# Patient Record
Sex: Male | Born: 1965 | Hispanic: Yes | Marital: Single | Smoking: Never smoker
Health system: Southern US, Community
[De-identification: ages and names within clinical notes are randomized; demographics above are authoritative.]

## PROBLEM LIST (undated history)

## (undated) DIAGNOSIS — I10 Essential (primary) hypertension: Secondary | ICD-10-CM

---

## 2021-10-13 ENCOUNTER — Emergency Department (HOSPITAL_BASED_OUTPATIENT_CLINIC_OR_DEPARTMENT_OTHER): Payer: Self-pay

## 2021-10-13 ENCOUNTER — Encounter (HOSPITAL_COMMUNITY)
Admission: EM | Disposition: A | Discharge status: Home / Self Care | Payer: Self-pay | Source: Home / Self Care | Attending: Family Medicine

## 2021-10-13 ENCOUNTER — Emergency Department (HOSPITAL_COMMUNITY): Payer: Self-pay | Admitting: Certified Registered Nurse Anesthetist

## 2021-10-13 ENCOUNTER — Other Ambulatory Visit: Payer: Self-pay

## 2021-10-13 ENCOUNTER — Other Ambulatory Visit (HOSPITAL_BASED_OUTPATIENT_CLINIC_OR_DEPARTMENT_OTHER): Payer: Self-pay

## 2021-10-13 ENCOUNTER — Inpatient Hospital Stay (HOSPITAL_BASED_OUTPATIENT_CLINIC_OR_DEPARTMENT_OTHER)
Admission: EM | Admit: 2021-10-13 | Discharge: 2021-10-16 | DRG: 065 | Disposition: A | Discharge status: Home / Self Care | Payer: Self-pay | Attending: Family Medicine | Admitting: Family Medicine

## 2021-10-13 ENCOUNTER — Encounter (HOSPITAL_BASED_OUTPATIENT_CLINIC_OR_DEPARTMENT_OTHER): Payer: Self-pay

## 2021-10-13 DIAGNOSIS — I10 Essential (primary) hypertension: Secondary | ICD-10-CM

## 2021-10-13 DIAGNOSIS — M4712 Other spondylosis with myelopathy, cervical region: Secondary | ICD-10-CM | POA: Diagnosis present

## 2021-10-13 DIAGNOSIS — E785 Hyperlipidemia, unspecified: Secondary | ICD-10-CM

## 2021-10-13 DIAGNOSIS — R0602 Shortness of breath: Secondary | ICD-10-CM | POA: Diagnosis present

## 2021-10-13 DIAGNOSIS — R1310 Dysphagia, unspecified: Secondary | ICD-10-CM

## 2021-10-13 DIAGNOSIS — I700 Atherosclerosis of aorta: Secondary | ICD-10-CM

## 2021-10-13 DIAGNOSIS — E119 Type 2 diabetes mellitus without complications: Secondary | ICD-10-CM | POA: Diagnosis present

## 2021-10-13 DIAGNOSIS — G959 Disease of spinal cord, unspecified: Secondary | ICD-10-CM

## 2021-10-13 DIAGNOSIS — I6350 Cerebral infarction due to unspecified occlusion or stenosis of unspecified cerebral artery: Principal | ICD-10-CM | POA: Diagnosis present

## 2021-10-13 DIAGNOSIS — R29701 NIHSS score 1: Secondary | ICD-10-CM | POA: Diagnosis present

## 2021-10-13 DIAGNOSIS — R1312 Dysphagia, oropharyngeal phase: Secondary | ICD-10-CM | POA: Diagnosis present

## 2021-10-13 DIAGNOSIS — R471 Dysarthria and anarthria: Secondary | ICD-10-CM | POA: Diagnosis present

## 2021-10-13 DIAGNOSIS — R29810 Facial weakness: Secondary | ICD-10-CM | POA: Diagnosis present

## 2021-10-13 DIAGNOSIS — M4802 Spinal stenosis, cervical region: Secondary | ICD-10-CM

## 2021-10-13 DIAGNOSIS — I639 Cerebral infarction, unspecified: Secondary | ICD-10-CM | POA: Diagnosis present

## 2021-10-13 DIAGNOSIS — Z20822 Contact with and (suspected) exposure to covid-19: Secondary | ICD-10-CM | POA: Diagnosis present

## 2021-10-13 DIAGNOSIS — R2990 Unspecified symptoms and signs involving the nervous system: Secondary | ICD-10-CM | POA: Diagnosis present

## 2021-10-13 HISTORY — PX: ESOPHAGOGASTRODUODENOSCOPY (EGD) WITH PROPOFOL: SHX5813

## 2021-10-13 HISTORY — DX: Essential (primary) hypertension: I10

## 2021-10-13 LAB — CBC WITH DIFFERENTIAL/PLATELET
Abs Immature Granulocytes: 0.03 10*3/uL (ref 0.00–0.07)
Basophils Absolute: 0 10*3/uL (ref 0.0–0.1)
Basophils Relative: 0 %
Eosinophils Absolute: 0.1 10*3/uL (ref 0.0–0.5)
Eosinophils Relative: 1 %
HCT: 45.2 % (ref 39.0–52.0)
Hemoglobin: 15.7 g/dL (ref 13.0–17.0)
Immature Granulocytes: 0 %
Lymphocytes Relative: 17 %
Lymphs Abs: 1.3 10*3/uL (ref 0.7–4.0)
MCH: 31.3 pg (ref 26.0–34.0)
MCHC: 34.7 g/dL (ref 30.0–36.0)
MCV: 90 fL (ref 80.0–100.0)
Monocytes Absolute: 0.4 10*3/uL (ref 0.1–1.0)
Monocytes Relative: 6 %
Neutro Abs: 5.5 10*3/uL (ref 1.7–7.7)
Neutrophils Relative %: 76 %
Platelets: 274 10*3/uL (ref 150–400)
RBC: 5.02 MIL/uL (ref 4.22–5.81)
RDW: 13.3 % (ref 11.5–15.5)
WBC: 7.3 10*3/uL (ref 4.0–10.5)
nRBC: 0 % (ref 0.0–0.2)

## 2021-10-13 LAB — RESP PANEL BY RT-PCR (FLU A&B, COVID) ARPGX2
Influenza A by PCR: NEGATIVE
Influenza B by PCR: NEGATIVE
SARS Coronavirus 2 by RT PCR: NEGATIVE

## 2021-10-13 LAB — COMPREHENSIVE METABOLIC PANEL
ALT: 21 U/L (ref 0–44)
AST: 26 U/L (ref 15–41)
Albumin: 4.8 g/dL (ref 3.5–5.0)
Alkaline Phosphatase: 80 U/L (ref 38–126)
Anion gap: 12 (ref 5–15)
BUN: 10 mg/dL (ref 6–20)
CO2: 28 mmol/L (ref 22–32)
Calcium: 9.2 mg/dL (ref 8.9–10.3)
Chloride: 96 mmol/L — ABNORMAL LOW (ref 98–111)
Creatinine, Ser: 0.81 mg/dL (ref 0.61–1.24)
GFR, Estimated: >60 mL/min (ref >60)
Glucose, Bld: 121 mg/dL — ABNORMAL HIGH (ref 70–99)
Potassium: 3.3 mmol/L — ABNORMAL LOW (ref 3.5–5.1)
Sodium: 136 mmol/L (ref 135–145)
Total Bilirubin: 1.2 mg/dL (ref 0.3–1.2)
Total Protein: 8.8 g/dL — ABNORMAL HIGH (ref 6.5–8.1)

## 2021-10-13 LAB — TROPONIN I (HIGH SENSITIVITY)
Troponin I (High Sensitivity): 12 ng/L (ref <18)
Troponin I (High Sensitivity): 13 ng/L (ref <18)

## 2021-10-13 LAB — GROUP A STREP BY PCR: Group A Strep by PCR: NOT DETECTED

## 2021-10-13 IMAGING — CT CT NECK W/ CM
3 of 4 series · 13 of 33 positions shown, 16 images · IV contrast (Omnipaque)
Comparison: None.

CLINICAL DATA: Palate weakness, difficulty swallowing, evaluate for
retropharyngeal abscess and lemierre's

EXAM:
CT NECK WITH CONTRAST
TECHNIQUE: Multidetector CT imaging of the neck was performed using the
standard protocol following the bolus administration of intravenous
contrast.
CONTRAST:  75mL OMNIPAQUE IOHEXOL 300 MG/ML  SOLN

[Series 3: axial neck · axial · 0.44mm/px · z∈[-238,-80]mm · 5 of 119 slices shown, 7 images]
[im 20/119  soft-tissue]
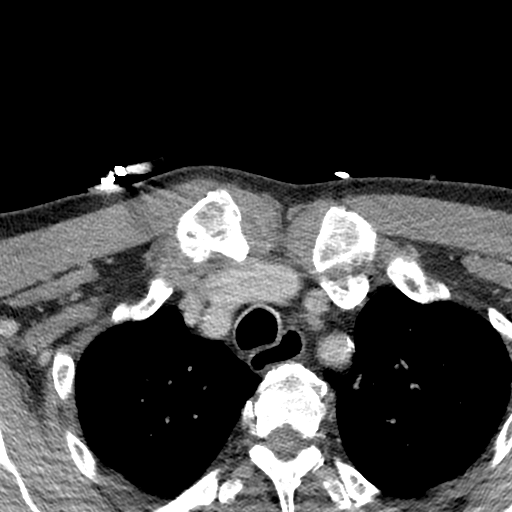
[im 20/119  bone]
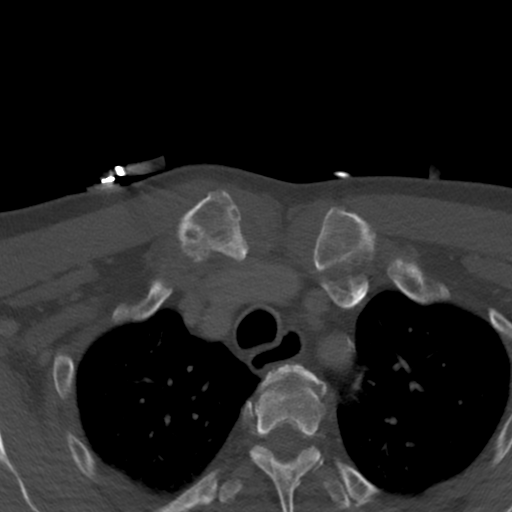
[im 40/119  bone]
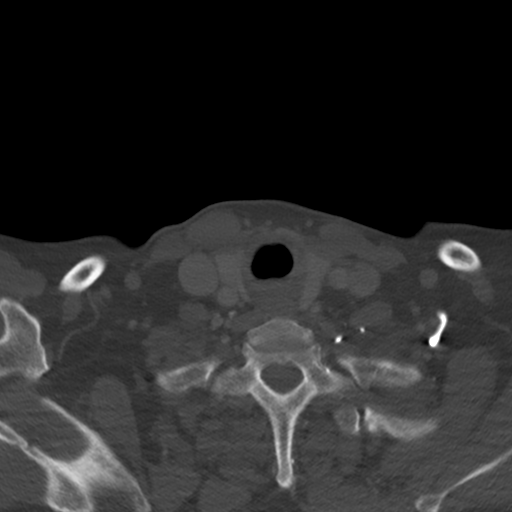
[im 60/119  bone]
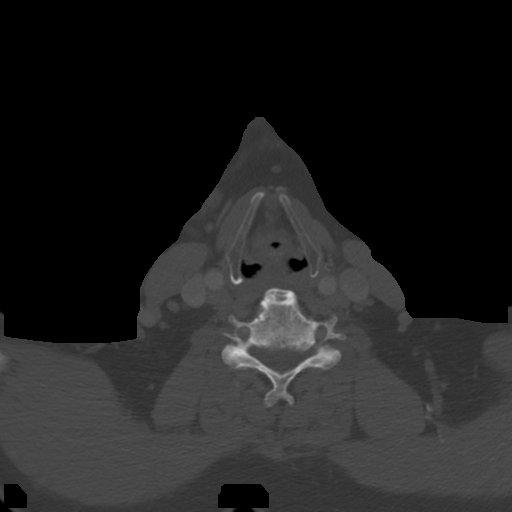
[im 79/119  bone]
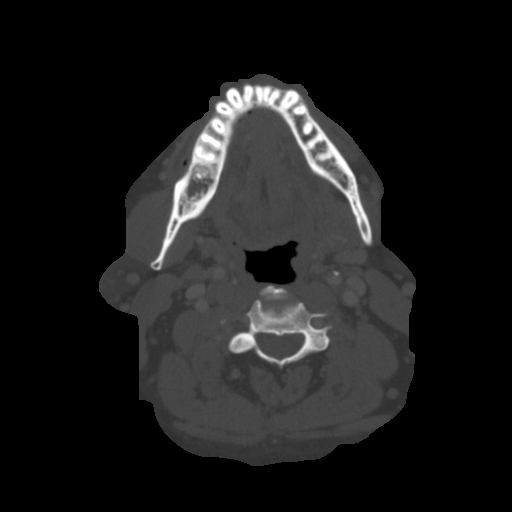
[im 99/119  soft-tissue]
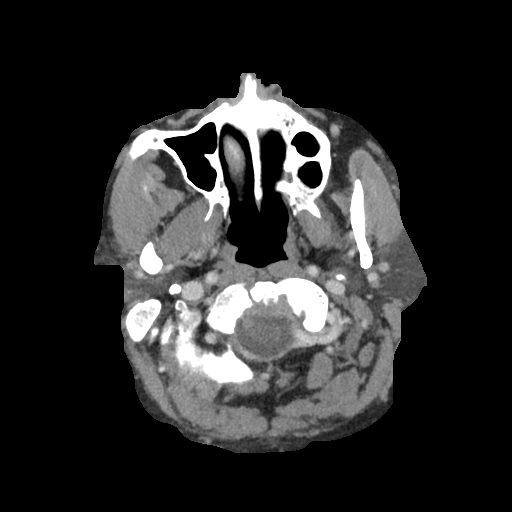
[im 99/119  bone]
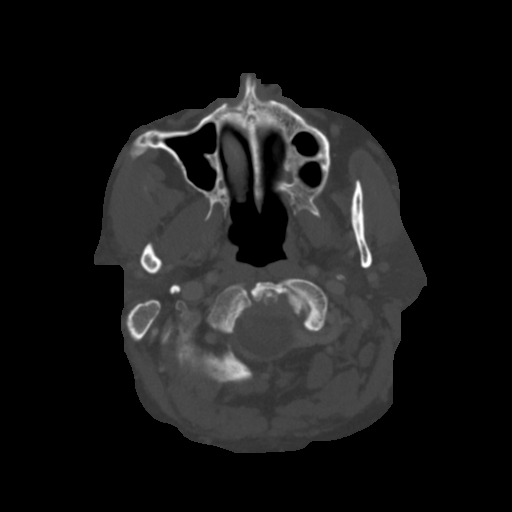

[Series 6: sag neck · sagittal · 0.44mm/px · 5 of 89 slices shown, 6 images]
[im 30/89  bone]
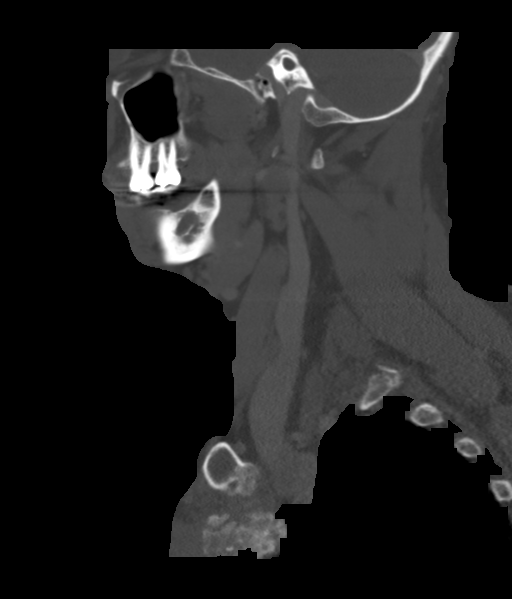
[im 37/89  bone]
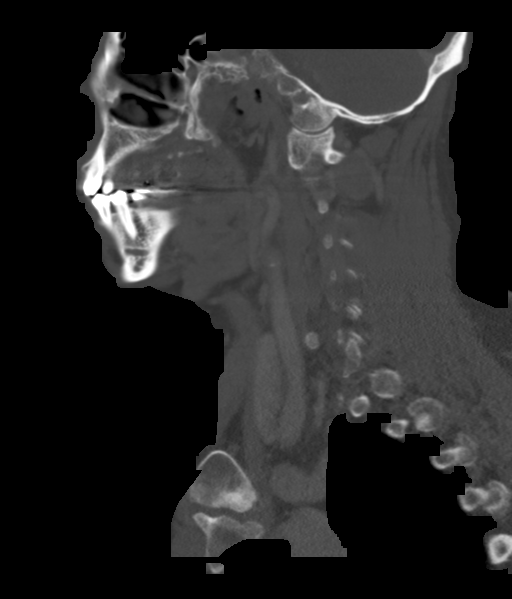
[im 45/89  soft-tissue]
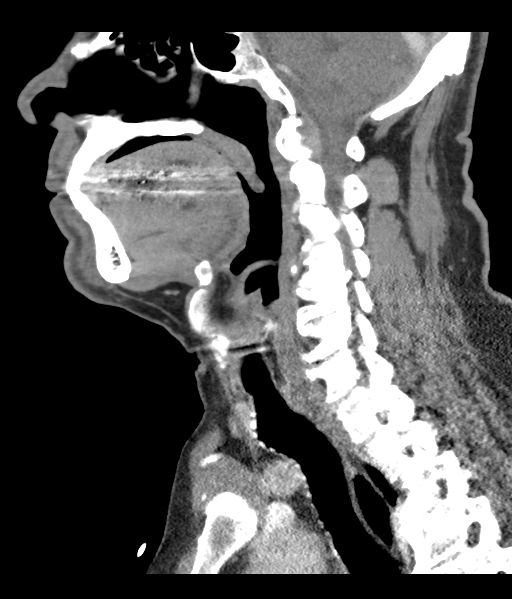
[im 45/89  bone]
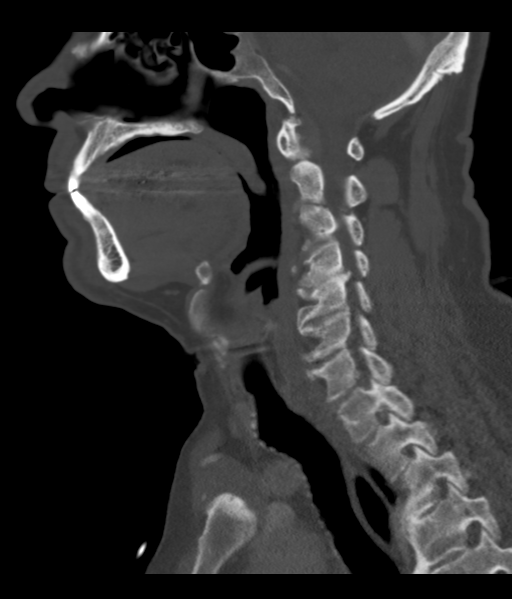
[im 52/89  bone]
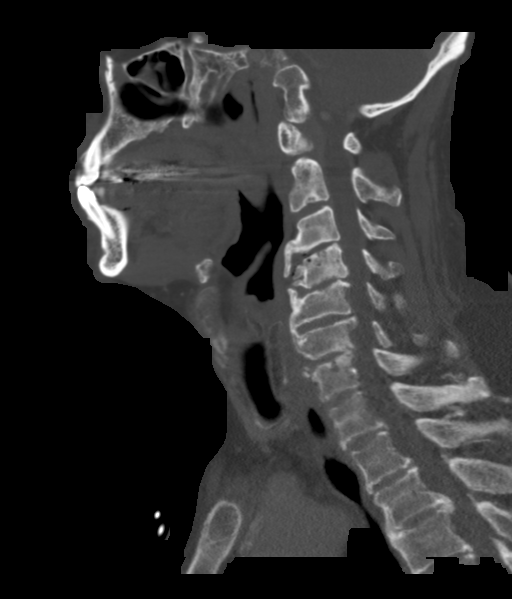
[im 59/89  bone]
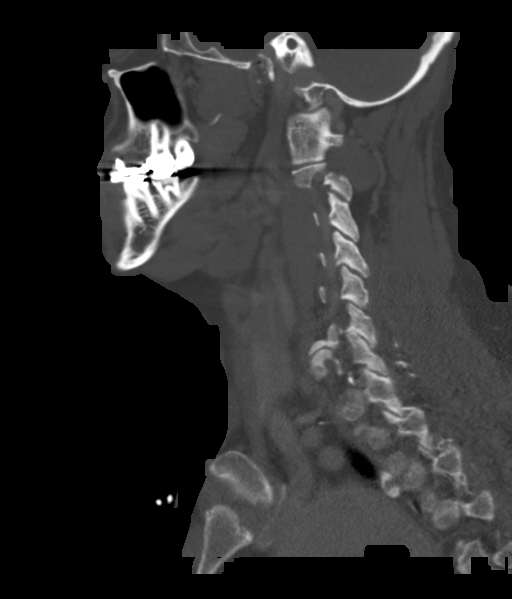

[Series 7: cor neck · coronal · 0.36mm/px · 3 of 104 slices shown]
[im 21/104  bone]
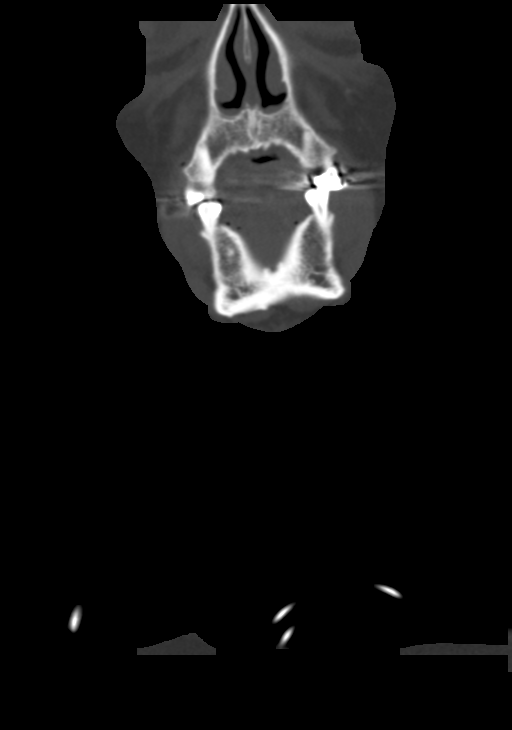
[im 42/104  bone]
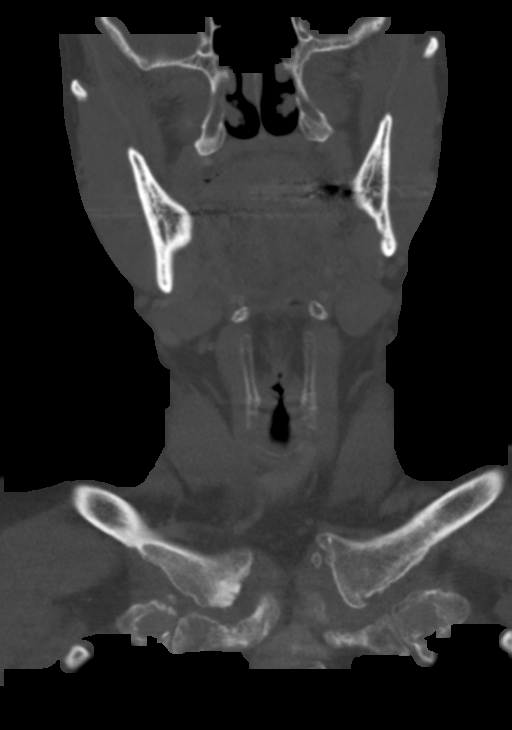
[im 62/104  bone]
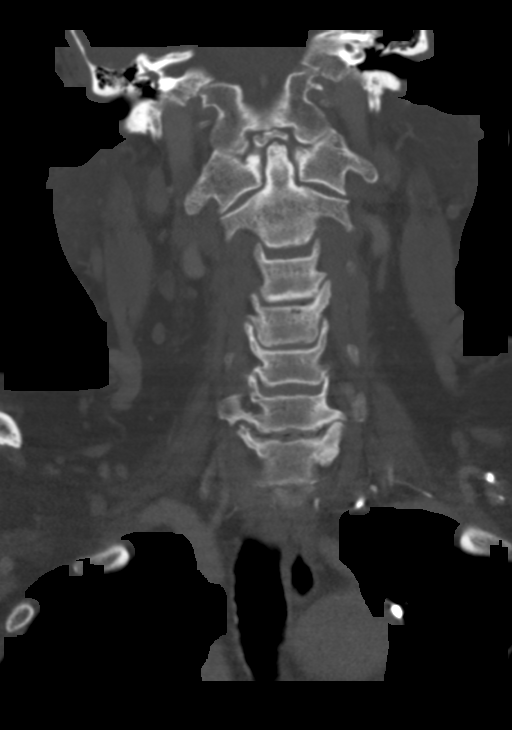

[13 of 33 positions shown; findings below may reference images not displayed]

FINDINGS: Pharynx and larynx: Normal. No mass or swelling.

Salivary glands: No inflammation, mass, or stone.

Thyroid: Normal.

Lymph nodes: None enlarged or abnormal density.

Vascular: Grossly patent arteries and veins.

Limited intracranial: Negative.

Visualized orbits: Negative.

Mastoids and visualized paranasal sinuses: Clear.

Skeleton: Degenerative changes in the cervical spine, with
ossification of the posterior longitudinal ligament at C4-C5 causing
at least moderate spinal canal stenosis. No acute osseous
abnormality.

Upper chest: Focal pulmonary opacity.

Other: None
IMPRESSION: No acute process in the neck. No etiology is seen for the patient's
difficulty swallowing.

## 2021-10-13 IMAGING — DX DG CHEST 1V PORT
1 series · 1 of 1 positions shown · non-contrast
Comparison: None.

CLINICAL DATA: Difficulty swallowing.  Short of breath.

EXAM:
PORTABLE CHEST 1 VIEW

[chest ap]
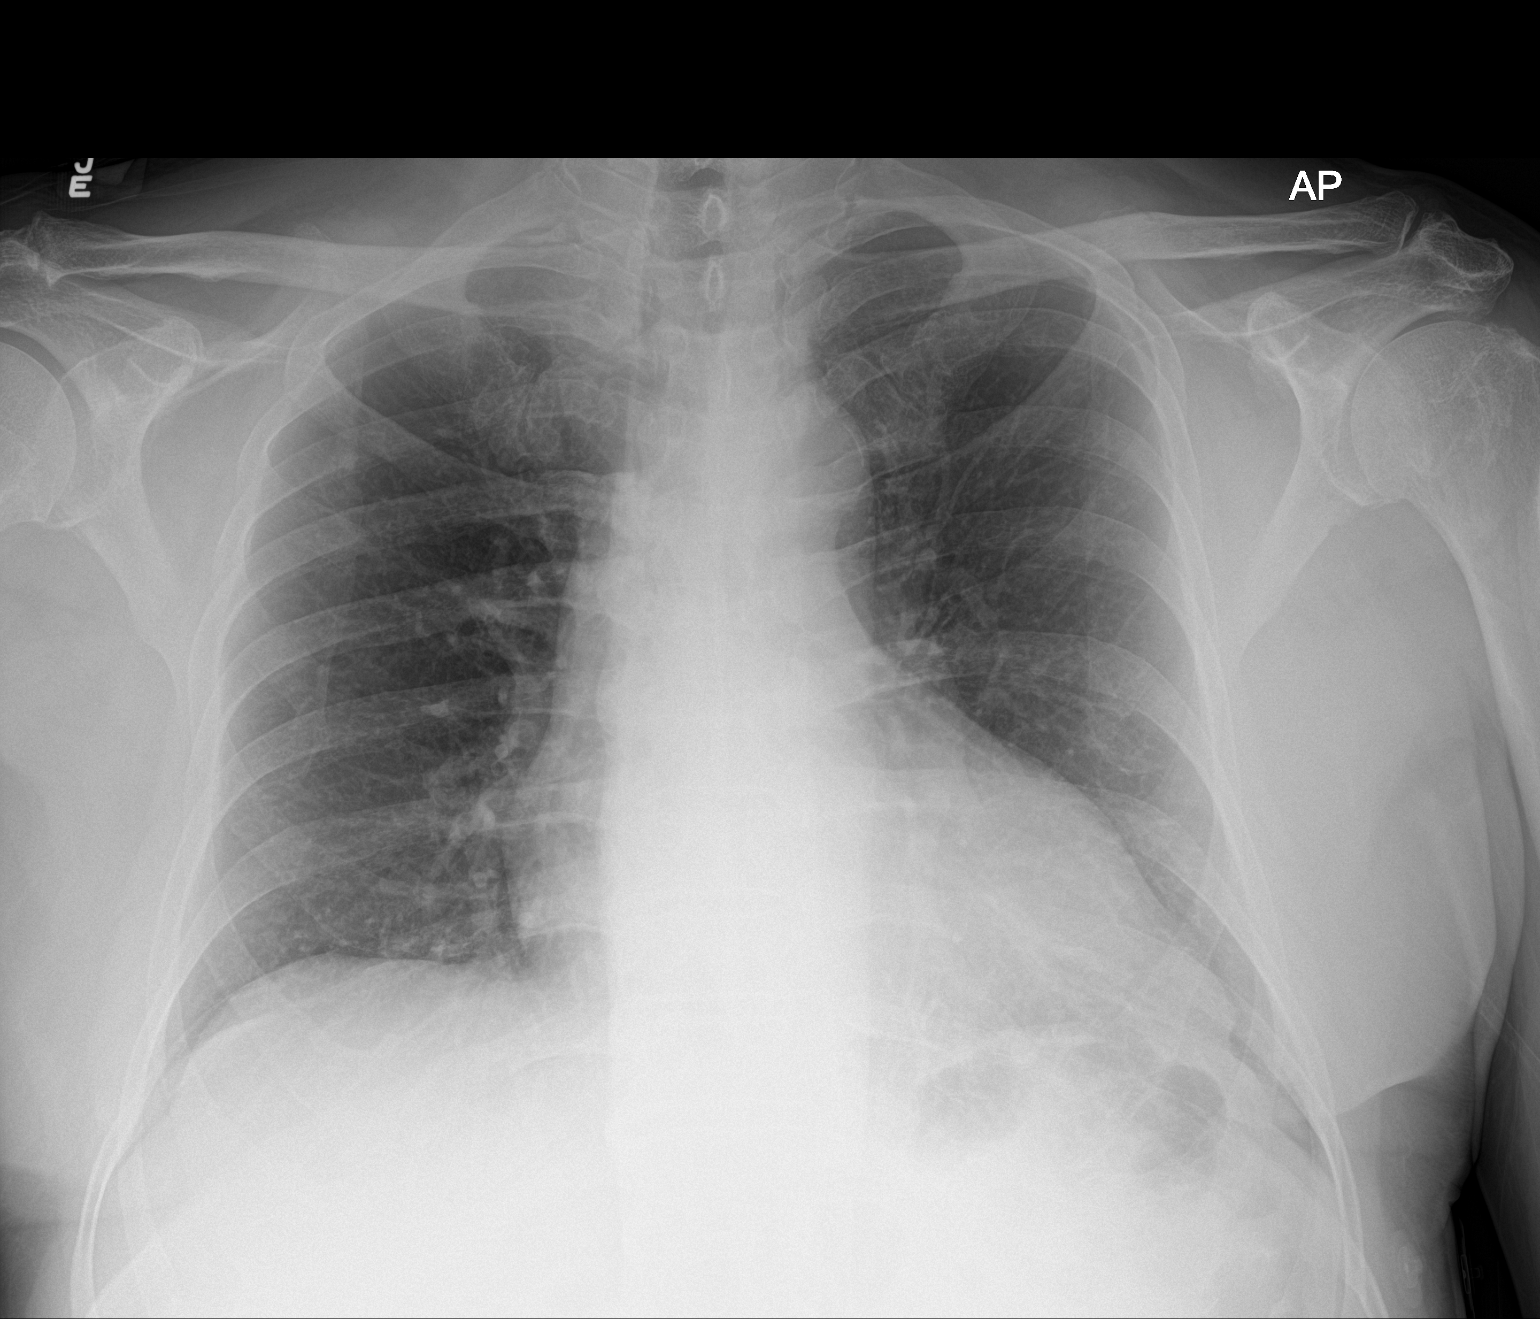

[1 of 1 positions shown; findings below may reference images not displayed]

FINDINGS: Mild cardiac enlargement. Vascularity normal. Lungs are clear
without infiltrate or effusion.
IMPRESSION: No active disease.

## 2021-10-13 SURGERY — ESOPHAGOGASTRODUODENOSCOPY (EGD) WITH PROPOFOL
Anesthesia: General

## 2021-10-13 MED ORDER — LIDOCAINE VISCOUS HCL 2 % MT SOLN
15.0000 mL | Freq: Once | OROMUCOSAL | Status: AC
  Administered 2021-10-14: 15 mL via OROMUCOSAL
  Filled 2021-10-13: qty 15

## 2021-10-13 MED ORDER — IOHEXOL 300 MG/ML  SOLN
100.0000 mL | Freq: Once | INTRAMUSCULAR | Status: AC | PRN
  Administered 2021-10-13: 75 mL via INTRAVENOUS

## 2021-10-13 MED ORDER — SODIUM CHLORIDE 0.9 % IV SOLN: INTRAVENOUS | Status: DC

## 2021-10-13 MED ORDER — PANTOPRAZOLE SODIUM 40 MG PO TBEC
40.0000 mg | DELAYED_RELEASE_TABLET | Freq: Every day | ORAL | 0 refills | Status: DC
  Filled 2021-10-13: qty 30, 30d supply, fill #0

## 2021-10-13 MED ORDER — SUCCINYLCHOLINE CHLORIDE 200 MG/10ML IV SOSY
PREFILLED_SYRINGE | INTRAVENOUS | Status: DC | PRN
  Administered 2021-10-13: 100 mg via INTRAVENOUS

## 2021-10-13 MED ORDER — PROPOFOL 10 MG/ML IV BOLUS
INTRAVENOUS | Status: DC | PRN
Start: 2021-10-13 — End: 2021-10-13
  Administered 2021-10-13: 130 mg via INTRAVENOUS

## 2021-10-13 MED ORDER — ALUM & MAG HYDROXIDE-SIMETH 200-200-20 MG/5ML PO SUSP
30.0000 mL | Freq: Once | ORAL | Status: AC
  Administered 2021-10-13: 30 mL via ORAL
  Filled 2021-10-13: qty 30

## 2021-10-13 MED ORDER — LACTATED RINGERS IV SOLN
INTRAVENOUS | Status: AC | PRN
  Administered 2021-10-13: 1000 mL via INTRAVENOUS

## 2021-10-13 MED ORDER — AMLODIPINE BESYLATE 5 MG PO TABS
5.0000 mg | ORAL_TABLET | Freq: Every day | ORAL | 0 refills | Status: DC
  Filled 2021-10-13: qty 30, 30d supply, fill #0

## 2021-10-13 MED ORDER — DEXAMETHASONE SODIUM PHOSPHATE 10 MG/ML IJ SOLN
INTRAMUSCULAR | Status: DC | PRN
  Administered 2021-10-13: 4 mg via INTRAVENOUS

## 2021-10-13 MED ORDER — SODIUM CHLORIDE 0.9 % IV BOLUS
500.0000 mL | Freq: Once | INTRAVENOUS | Status: AC
  Administered 2021-10-13: 500 mL via INTRAVENOUS

## 2021-10-13 MED ORDER — LIDOCAINE 2% (20 MG/ML) 5 ML SYRINGE
INTRAMUSCULAR | Status: DC | PRN
  Administered 2021-10-13: 80 mg via INTRAVENOUS

## 2021-10-13 MED ORDER — GLUCAGON HCL RDNA (DIAGNOSTIC) 1 MG IJ SOLR
1.0000 mg | Freq: Once | INTRAMUSCULAR | Status: AC
Start: 2021-10-13 — End: 2021-10-13
  Administered 2021-10-13: 1 mg via INTRAVENOUS
  Filled 2021-10-13: qty 1

## 2021-10-13 MED ORDER — ONDANSETRON HCL 4 MG/2ML IJ SOLN
4.0000 mg | Freq: Once | INTRAMUSCULAR | Status: AC
  Administered 2021-10-13: 4 mg via INTRAVENOUS
  Filled 2021-10-13: qty 2

## 2021-10-13 MED ORDER — ONDANSETRON HCL 4 MG/2ML IJ SOLN
INTRAMUSCULAR | Status: DC | PRN
  Administered 2021-10-13: 4 mg via INTRAVENOUS

## 2021-10-13 MED ORDER — LIDOCAINE VISCOUS HCL 2 % MT SOLN
15.0000 mL | Freq: Once | OROMUCOSAL | Status: AC
  Administered 2021-10-13: 15 mL via ORAL
  Filled 2021-10-13: qty 15

## 2021-10-13 MED ORDER — PROPOFOL 500 MG/50ML IV EMUL
INTRAVENOUS | Status: DC | PRN
Start: 2021-10-13 — End: 2021-10-13
  Administered 2021-10-13: 150 ug/kg/min via INTRAVENOUS

## 2021-10-13 SURGICAL SUPPLY — 15 items

## 2021-10-13 NOTE — Anesthesia Procedure Notes (Signed)
Procedure Name: Intubation Date/Time: 10/13/2021 9:14 PM Performed by: Ezekiel Ina, CRNA Pre-anesthesia Checklist: Patient identified, Emergency Drugs available, Suction available and Patient being monitored Patient Re-evaluated:Patient Re-evaluated prior to induction Oxygen Delivery Method: Circle system utilized Preoxygenation: Pre-oxygenation with 100% oxygen Induction Type: IV induction Ventilation: Mask ventilation without difficulty Laryngoscope Size: Miller and 2 Grade View: Grade I Tube type: Oral Tube size: 7.5 mm Number of attempts: 1 Airway Equipment and Method: Stylet Placement Confirmation: ETT inserted through vocal cords under direct vision, positive ETCO2 and breath sounds checked- equal and bilateral Secured at: 23 cm Tube secured with: Tape Dental Injury: Teeth and Oropharynx as per pre-operative assessment

## 2021-10-13 NOTE — ED Notes (Signed)
RT assessed in triage. BBS clear. Good air movement in throat. Stated he was having trouble swallowing earlier, but has gotten better. SAT 98%

## 2021-10-13 NOTE — Op Note (Signed)
[facility] Patient Name: Gary Richmond Procedure Date: 10/13/2021 MRN: [id number] Attending MD: Kerin Salen , MD Date of Birth: 1966-10-09 CSN: [id number] Age: 56 Admit Type: Outpatient Procedure:                Upper GI endoscopy Indications:              Dysphagia, inability to tolerate liquids without                            immediate regurgitation, suspected esophageal food                            bolus impaction Providers:                Kerin Salen, MD, Roselie Awkward, RN, Rozetta Nunnery,                            Technician Referring MD:             ER physician Medicines:                Monitored Anesthesia Care Complications:            No immediate complications. Estimated Blood Loss:     Estimated blood loss: none. Procedure:                Pre-Anesthesia Assessment:                           - Prior to the procedure, a History and Physical                            was performed, and patient medications and                            allergies were reviewed. The patient's tolerance of                            previous anesthesia was also reviewed. The risks                            and benefits of the procedure and the sedation                            options and risks were discussed with the patient.                            All questions were answered, and informed consent                            was obtained. Prior Anticoagulants: The patient has                            taken no previous anticoagulant or antiplatelet                            agents. ASA Grade Assessment:  II - A patient with                            mild systemic disease. After reviewing the risks                            and benefits, the patient was deemed in                            satisfactory condition to undergo the procedure.                           After obtaining informed consent, the endoscope was                            passed under  direct vision. Throughout the                            procedure, the patient's blood pressure, pulse, and                            oxygen saturations were monitored continuously. The                            [id number]-[id number] ([id number] Olympus endoscope was introduced                            through the mouth, and advanced to the third part                            of duodenum. The upper GI endoscopy was                            accomplished without difficulty. The patient                            tolerated the procedure well. Scope In: Scope Out: Findings:      The examined esophagus was normal.      The Z-line was regular.      A large ball of food (residue) was found in the gastric body.      The cardia and gastric fundus were normal on retroflexion.      Otherwise unremarkable gastric cavity, incisura, antrum and pylorus.      The examined duodenum was normal. Impression:               - Normal esophagus.                           - Z-line regular.                           - A large amount of food (residue) in the stomach.                           - Normal examined duodenum.                           -  No specimens collected. Moderate Sedation:      Patient did not receive moderate sedation for this procedure, but       instead received monitored anesthesia care. Recommendation:           - Discharge patient to home.                           - Advance diet as tolerated. Procedure Code(s):        --- Professional ---                           [phone], Esophagogastroduodenoscopy, flexible,                            transoral; diagnostic, including collection of                            specimen(s) by brushing or washing, when performed                            (separate procedure) Diagnosis Code(s):        --- Professional ---                           R13.10, Dysphagia, unspecified CPT copyright 2019 [employer]. All rights reserved. The codes  documented in this report are preliminary and upon coder review may  be revised to meet current compliance requirements. Kerin Salen, MD 10/13/2021 9:30:46 PM This report has been signed electronically. Number of Addenda: 0

## 2021-10-13 NOTE — ED Provider Notes (Addendum)
[facility] EMERGENCY DEPARTMENT Provider Note   CSN: [id number] Arrival date & time: 10/13/21  1113     History  Chief Complaint  Patient presents with   Shortness of Breath    Gary Richmond is a 56 y.o. male.  HPI      56yo male with history of hypertension, DM, presents with acute onset of difficulty swallowing, feeling of shortness of breath, discomfort in throat.   For one hour has had pain in the throat Feels clogged in throat but doesn't really hurt Feels difficulty speaking Shortness of breath, feels like can breath through nose but not mouth When drink water but water coming back up  Standing at the store waiting for the rain to stop 1.5 felt suddenly like couldn't swallow, like something stuck in throat, blocking, difficulty swallowing water Feels like voice different, difficulty speaking Throat feels a little bit sore but not severe, now doesn't feel sore  No cough or congestion in last few days  No headache, numbness, weakness, difficulty speaking/finding words  No chest pain, doesn't feel dyspne abut more that throat feels funny  No rash, nausea, vomiting, diarrhea, fever No hx of allergies  Spanish interpreter used  Home Medications Prior to Admission medications   Not on File      Allergies    Patient has no known allergies.    Review of Systems   Review of Systems  Constitutional:  Negative for fever.  HENT:  Positive for sore throat (slight), trouble swallowing and voice change. Negative for congestion.   Respiratory:  Positive for shortness of breath.   Gastrointestinal:  Negative for abdominal pain, nausea and vomiting.  Genitourinary:  Negative for dysuria.  Skin:  Negative for rash.  Neurological:  Negative for dizziness, facial asymmetry, speech difficulty, weakness, numbness and headaches.   Physical Exam Updated Vital Signs BP (!) 144/81    Pulse 68    Temp 97.8 F (36.6 C)    Resp 15    Ht 5\' 5"  (1.651 m)     Wt 72.6 kg    SpO2 95%    BMI 26.63 kg/m  Physical Exam Vitals and nursing note reviewed.  Constitutional:      General: He is not in acute distress.    Appearance: Normal appearance. He is well-developed. He is not ill-appearing or diaphoretic.  HENT:     Head: Normocephalic and atraumatic.     Mouth/Throat:     Pharynx: No pharyngeal swelling or oropharyngeal exudate.  Eyes:     General: No visual field deficit.    Extraocular Movements: Extraocular movements intact.     Conjunctiva/sclera: Conjunctivae normal.     Pupils: Pupils are equal, round, and reactive to light.  Neck:     Comments: Tender right submandibular area Cardiovascular:     Rate and Rhythm: Normal rate and regular rhythm.     Pulses: Normal pulses.     Heart sounds: Normal heart sounds. No murmur heard.   No friction rub. No gallop.  Pulmonary:     Effort: Pulmonary effort is normal. No respiratory distress.     Breath sounds: Normal breath sounds. No wheezing or rales.  Abdominal:     General: There is no distension.     Palpations: Abdomen is soft.     Tenderness: There is no abdominal tenderness. There is no guarding.  Musculoskeletal:        General: No swelling or tenderness.     Cervical back: Normal range  of motion.  Skin:    General: Skin is warm and dry.     Findings: No erythema or rash.  Neurological:     General: No focal deficit present.     Mental Status: He is alert and oriented to person, place, and time.     GCS: GCS eye subscore is 4. GCS verbal subscore is 5. GCS motor subscore is 6.     Cranial Nerves: No cranial nerve deficit, dysarthria or facial asymmetry.     Sensory: No sensory deficit.     Motor: No weakness or tremor.     Coordination: Coordination normal. Finger-Nose-Finger Test normal.     Gait: Gait normal.    ED Results / Procedures / Treatments   Labs (all labs ordered are listed, but only abnormal results are displayed) Labs Reviewed  COMPREHENSIVE METABOLIC  PANEL - Abnormal; Notable for the following components:      Result Value   Potassium 3.3 (*)    Chloride 96 (*)    Glucose, Bld 121 (*)    Total Protein 8.8 (*)    All other components within normal limits  RESP PANEL BY RT-PCR (FLU A&B, COVID) ARPGX2  GROUP A STREP BY PCR  CBC WITH DIFFERENTIAL/PLATELET  TROPONIN I (HIGH SENSITIVITY)  TROPONIN I (HIGH SENSITIVITY)    EKG EKG Interpretation  Date/Time:  Wednesday October 13 2021 11:27:34 EST Ventricular Rate:  77 PR Interval:  152 QRS Duration: 82 QT Interval:  404 QTC Calculation: 457 R Axis:   27 Text Interpretation: Sinus rhythm with Premature supraventricular complexes Left 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 CONTRAST:  17mL OMNIPAQUE IOHEXOL 300 MG/ML  SOLN COMPARISON:  None. FINDINGS: Pharynx and larynx: Normal. No mass or swelling. Salivary glands: No inflammation, mass, or stone. Thyroid: Normal. Lymph nodes: None enlarged or abnormal density. Vascular: Grossly patent arteries and veins. Limited intracranial: Negative. Visualized orbits: Negative. Mastoids and visualized paranasal sinuses: Clear. 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 No acute osseous abnormality. Upper chest: Focal pulmonary opacity. Other: None IMPRESSION: No acute process in the neck. No etiology is seen for the patient's difficulty swallowing.  Electronically Signed   By: Merilyn Baba M.D.   On: 10/13/2021 15:05   DG Chest Port 1 View  Result Date: 10/13/2021 CLINICAL DATA:  Difficulty swallowing.  Short of breath. EXAM: PORTABLE CHEST 1 VIEW COMPARISON:  None. FINDINGS: Mild cardiac enlargement. Vascularity normal. Lungs are clear without infiltrate or effusion. IMPRESSION: No active disease. Electronically Signed   By: Franchot Gallo M.D.   On: 10/13/2021 17:50    Procedures Procedures    Medications Ordered in ED Medications  0.9 %  sodium chloride infusion ( Intravenous New Bag/Given 10/13/21 1806)  lidocaine (XYLOCAINE) 2 % viscous mouth solution 15 mL (has no administration in time range)  iohexol (OMNIPAQUE) 300 MG/ML solution 100 mL (75 mLs Intravenous Contrast Given 10/13/21 1414)  alum & mag hydroxide-simeth (MAALOX/MYLANTA) 200-200-20 MG/5ML suspension 30 mL (30 mLs Oral Given 10/13/21 1613)  And  lidocaine (XYLOCAINE) 2 % viscous mouth solution 15 mL (15 mLs Oral Given 10/13/21 1613)  sodium chloride 0.9 % bolus 500 mL (0 mLs Intravenous Stopped 10/13/21 1942)  glucagon (human recombinant) (GLUCAGEN) injection 1 mg (1 mg Intravenous Given 10/13/21 1830)  ondansetron (ZOFRAN) injection 4 mg (4 mg Intravenous Given 10/13/21 1827)  lactated ringers infusion ( Intravenous Stopped 10/13/21 2139)    ED Course/ Medical Decision Making/ A&P                           Medical Decision Making  56yo male with history of hypertension, DM, presents with acute onset of difficulty swallowing, feeling of shortness of breath, discomfort in throat.   DDx includes anaphylaxis, CVA, cardiac etiology, RPA, PTA, food impaction, strep/covid/flu, other neurologic abnormality, reflux, globus sensation, vocal cord dysfunction with broad continuing differential.  EKG with STE, pattern most consistent with LVH, delta troponins negative and doubt ACS. No chest pain, just throat/swallowing problems.  No triggers or signs of anaphylaxis on exam or  history.  Strep/COVID/flu testing negative. CT soft tissue neck does not show any acute abnormalities, no abscess, no airway narrowing or masses.  Neurologic exam without abnormalities, palate appears to have normal rise, do not see signs of CVA.   Observed to swallow water without aspiration or regurgitation, was not eating when this began and given tolerance of fluid on my exam I doubt food impaction.   Discussed medical decision making in detail using spanish interpreter. Ordered GI cocktail, plan to reinitiate blood pressure medications and start PPI for possible reflux and refer to ENT for further evaluation of dysphagia, PCP for formal speech evaluation.      Final Clinical Impression(s) / ED Diagnoses Final diagnoses:  Dysphagia, unspecified type    Rx / DC Orders ED Discharge Orders          Ordered    pantoprazole (PROTONIX) 40 MG tablet  Daily,   Status:  Discontinued        10/13/21 1603    amLODipine (NORVASC) 5 MG tablet  Daily,   Status:  Discontinued        10/13/21 1603                Gareth Morgan, MD 10/14/21 [phone]

## 2021-10-13 NOTE — ED Notes (Signed)
All belongings gathered and placed at bedside. Pt updated with interpreter and all questions answered.

## 2021-10-13 NOTE — ED Provider Notes (Addendum)
Patient has shifted from concerns about an abnormality in the pharyngeal area to possible esophageal impaction.  Patient not keeping saliva down.  Patient when he swallows water seems to go past the area of the pharynx and then eventually comes back up.  May be a communication issue with patient.  Patient apparently had acute onset of this just prior to arrival.  Also of significance patient stated that this started to happen when he was eating and drinking.  Patient thought his throat was closing off.  Patient's voice is very normal.  No trouble speaking at all.  Clinically it acts as if there is an esophageal food impaction.  Will discuss with gastroenterology for consideration for upper endoscopy.  We will add on chest x-ray we will start some IV fluids.   Vanetta Mulders, MD 10/13/21 1733    Vanetta Mulders, MD 10/13/21 1735  CRITICAL CARE Performed by: Vanetta Mulders Total critical care time: 35 minutes Critical care time was exclusive of separately billable procedures and treating other patients. Critical care was necessary to treat or prevent imminent or life-threatening deterioration. Critical care was time spent personally by me on the following activities: development of treatment plan with patient and/or surrogate as well as nursing, discussions with consultants, evaluation of patient's response to treatment, examination of patient, obtaining history from patient or surrogate, ordering and performing treatments and interventions, ordering and review of laboratory studies, ordering and review of radiographic studies, pulse oximetry and re-evaluation of patient's condition.   Discussed with Dr. Marca Ancona on-call for gastroenterology unassigned at [facility].  Initially she wanted to try glucagon.  We have given him good colon but have not had much chance to see if it was going to work she called back change her mind said she probably best to just scope him.  This may just be dysphagia.   But it is seems that when he drinks fluids that something gets stuck about mid esophagus.  According to him last food was 7 PM yesterday did not eat anything today has had trouble with drinking stuff today.  Plan to send him to the [facility] long ED discussed with Dr. Wallace Cullens [facility] will transfer.  When he arrives Dr. Marca Ancona and endoscopy team needs to be notified.  They are waiting for him.  If the scope is negative then it will be a question whether he is able to swallow liquids at all if not he may require hospitalist admission.  Obvious if they find a food impaction then clearing that will probably solve the problem.   Vanetta Mulders, MD 10/13/21 Cecille Aver, MD 10/13/21 [phone]

## 2021-10-13 NOTE — ED Notes (Signed)
Pt attempted to swallow water. Pt drank water and then a few second later pt indicated he could not keep it down and spat it back up. Pt did not cough.

## 2021-10-13 NOTE — Anesthesia Preprocedure Evaluation (Addendum)
Anesthesia Evaluation  Patient identified by MRN, date of birth, ID band Patient awake    Reviewed: Allergy & Precautions, NPO status , Patient's Chart, lab work & pertinent test results  Airway Mallampati: II  TM Distance: >3 FB Neck ROM: Full    Dental no notable dental hx. (+) Teeth Intact, Dental Advisory Given, Implants,    Pulmonary neg pulmonary ROS,    Pulmonary exam normal breath sounds clear to auscultation       Cardiovascular Exercise Tolerance: Good hypertension, Pt. on medications Normal cardiovascular exam Rhythm:Regular Rate:Normal  10/13/21 EKG  SR R 77 w PAC and LVH   Neuro/Psych negative neurological ROS  negative psych ROS   GI/Hepatic GERD  ,  Endo/Other  negative endocrine ROS  Renal/GU Lab Results      Component                Value               Date                      CREATININE               0.81                10/13/2021                BUN                      10                  10/13/2021                        K                        3.3 (L)             10/13/2021                    Musculoskeletal negative musculoskeletal ROS (+)   Abdominal   Peds  Hematology Lab Results      Component                Value               Date                          HGB                      15.7                10/13/2021                HCT                      45.2                10/13/2021                PLT                      274                 10/13/2021              Anesthesia Other  Findings   Reproductive/Obstetrics                           Anesthesia Physical Anesthesia Plan  ASA: 2 and emergent  Anesthesia Plan: General   Post-op Pain Management:    Induction: Cricoid pressure planned, Rapid sequence and Intravenous  PONV Risk Score and Plan: 2 and Treatment may vary due to age or medical condition and TIVA  Airway Management Planned: Oral  ETT  Additional Equipment: None  Intra-op Plan:   Post-operative Plan: Extubation in OR  Informed Consent: I have reviewed the patients History and Physical, chart, labs and discussed the procedure including the risks, benefits and alternatives for the proposed anesthesia with the patient or authorized representative who has indicated his/her understanding and acceptance.     Dental advisory given and Interpreter used for interveiw  Plan Discussed with: CRNA and Anesthesiologist  Anesthesia Plan Comments: Paramedic used )    Anesthesia Quick Evaluation

## 2021-10-13 NOTE — H&P (Signed)
Gary Richmond is an 56 y.o. male.   Chief Complaint: Difficulty swallowing solids or liquids HPI: 56 year old Spanish-speaking male states that he was in his usual state of health until 7 PM yesterday after which he has noted difficulty swallowing liquids. Patient is a poor historian but states that yesterday he ate hamburgers, hot dogs without any issues and never felt like food was stuck in his throat or esophagus.  There after when he tried drinking fluids such as soda he noticed that it would immediately come up. Patient states he has not had anything to eat the whole day today, however on several attempts at trying to drink, the fluid comes up immediately. Patient denies prior difficulty swallowing solids or liquids. In the ER he was found to be unable to tolerate liquids. As per ER attending, he has been in the ER for over 7 hours without change in symptoms. He was given glucagon 1 dose in the ER and transferred to [facility] endoscopy for emergent/urgent EGD.  Past Medical History:  Diagnosis Date   Hypertension     History reviewed. No pertinent surgical history.  History reviewed. No pertinent family history. Social History:  reports that he has never smoked. He has never used smokeless tobacco. He reports current alcohol use. He reports that he does not use drugs.  Allergies: No Known Allergies  No medications prior to admission.    Results for orders placed or performed during the hospital encounter of 10/13/21 (from the past 48 hour(s))  CBC with Differential     Status: None   Collection Time: 10/13/21 12:28 PM  Result Value Ref Range   WBC 7.3 4.0 - 10.5 K/uL   RBC 5.02 4.22 - 5.81 MIL/uL   Hemoglobin 15.7 13.0 - 17.0 g/dL   HCT 16.1 09.6 - 04.5 %   MCV 90.0 80.0 - 100.0 fL   MCH 31.3 26.0 - 34.0 pg   MCHC 34.7 30.0 - 36.0 g/dL   RDW 40.9 81.1 - 91.4 %   Platelets 274 150 - 400 K/uL   nRBC 0.0 0.0 - 0.2 %   Neutrophils Relative % 76 %   Neutro Abs 5.5  1.7 - 7.7 K/uL   Lymphocytes Relative 17 %   Lymphs Abs 1.3 0.7 - 4.0 K/uL   Monocytes Relative 6 %   Monocytes Absolute 0.4 0.1 - 1.0 K/uL   Eosinophils Relative 1 %   Eosinophils Absolute 0.1 0.0 - 0.5 K/uL   Basophils Relative 0 %   Basophils Absolute 0.0 0.0 - 0.1 K/uL   Immature Granulocytes 0 %   Abs Immature Granulocytes 0.03 0.00 - 0.07 K/uL    Comment: Performed at [facility], [address] [place], [place] [address]  Troponin I (High Sensitivity)     Status: None   Collection Time: 10/13/21 12:28 PM  Result Value Ref Range   Troponin I (High Sensitivity) 12 <18 ng/L    Comment: (NOTE) Elevated high sensitivity troponin I (hsTnI) values and significant  changes across serial measurements may suggest ACS but many other  chronic and acute conditions are known to elevate hsTnI results.  Refer to the "Links" section for chest pain algorithms and additional  guidance. Performed at [facility], [address] [place], [place] [address]   Resp Panel by RT-PCR (Flu A&B, Covid) Nasopharyngeal Swab     Status: None   Collection Time: 10/13/21 12:29 PM   Specimen: Nasopharyngeal Swab; Nasopharyngeal(NP) swabs in  vial transport medium  Result Value Ref Range   SARS Coronavirus 2 by RT PCR NEGATIVE NEGATIVE    Comment: (NOTE) SARS-CoV-2 target nucleic acids are NOT DETECTED.  The SARS-CoV-2 RNA is generally detectable in upper respiratory specimens during the acute phase of infection. The lowest concentration of SARS-CoV-2 viral copies this assay can detect is 138 copies/mL. A negative result does not preclude SARS-Cov-2 infection and should not be used as the sole basis for treatment or other patient management decisions. A negative result may occur with  improper specimen collection/handling, submission of specimen other than nasopharyngeal swab, presence of viral mutation(s) within the areas targeted by this assay, and inadequate number of  viral copies(<138 copies/mL). A negative result must be combined with clinical observations, patient history, and epidemiological information. The expected result is Negative.  Fact Sheet for Patients:  BloggerCourse.comhttps://www.fda.gov/media/152166/download  Fact Sheet for Healthcare Providers:  SeriousBroker.ithttps://www.fda.gov/media/152162/download  This test is no t yet approved or cleared by the [place] FDA and  has been authorized for detection and/or diagnosis of SARS-CoV-2 by FDA under an Emergency Use Authorization (EUA). This EUA will remain  in effect (meaning this test can be used) for the duration of the COVID-19 declaration under Section 564(b)(1) of the Act, 21 U.S.C.section 360bbb-3(b)(1), unless the authorization is terminated  or revoked sooner.       Influenza A by PCR NEGATIVE NEGATIVE   Influenza B by PCR NEGATIVE NEGATIVE    Comment: (NOTE) The Xpert Xpress SARS-CoV-2/FLU/RSV plus assay is intended as an aid in the diagnosis of influenza from Nasopharyngeal swab specimens and should not be used as a sole basis for treatment. Nasal washings and aspirates are unacceptable for Xpert Xpress SARS-CoV-2/FLU/RSV testing.  Fact Sheet for Patients: BloggerCourse.comhttps://www.fda.gov/media/152166/download  Fact Sheet for Healthcare Providers: SeriousBroker.ithttps://www.fda.gov/media/152162/download  This test is not yet approved or cleared by the [place] FDA and has been authorized for detection and/or diagnosis of SARS-CoV-2 by FDA under an Emergency Use Authorization (EUA). This EUA will remain in effect (meaning this test can be used) for the duration of the COVID-19 declaration under Section 564(b)(1) of the Act, 21 U.S.C. section 360bbb-3(b)(1), unless the authorization is terminated or revoked.  Performed at [facility], [address] [place], [place] [address]   Group A Strep by PCR     Status: None   Collection Time: 10/13/21 12:29 PM   Specimen: Nasopharyngeal Swab; Sterile Swab   Result Value Ref Range   Group A Strep by PCR NOT DETECTED NOT DETECTED    Comment: Performed at [facility], [address] [place], [place] [address]  Comprehensive metabolic panel     Status: Abnormal   Collection Time: 10/13/21  1:34 PM  Result Value Ref Range   Sodium 136 135 - 145 mmol/L   Potassium 3.3 (L) 3.5 - 5.1 mmol/L   Chloride 96 (L) 98 - 111 mmol/L   CO2 28 22 - 32 mmol/L   Glucose, Bld 121 (H) 70 - 99 mg/dL    Comment: Glucose reference range applies only to samples taken after fasting for at least 8 hours.   BUN 10 6 - 20 mg/dL   Creatinine, Ser 4.130.81 0.61 - 1.24 mg/dL   Calcium 9.2 8.9 - 24.410.3 mg/dL   Total Protein 8.8 (H) 6.5 - 8.1 g/dL   Albumin 4.8 3.5 - 5.0 g/dL   AST 26 15 - 41 U/L   ALT 21 0 - 44 U/L   Alkaline  Phosphatase 80 38 - 126 U/L   Total Bilirubin 1.2 0.3 - 1.2 mg/dL   GFR, Estimated >50 >03 mL/min    Comment: (NOTE) Calculated using the CKD-EPI Creatinine Equation (2021)    Anion gap 12 5 - 15    Comment: Performed at [facility], [address] [place], [place] [address]  Troponin I (High Sensitivity)     Status: None   Collection Time: 10/13/21  2:40 PM  Result Value Ref Range   Troponin I (High Sensitivity) 13 <18 ng/L    Comment: (NOTE) Elevated high sensitivity troponin I (hsTnI) values and significant  changes across serial measurements may suggest ACS but many other  chronic and acute conditions are known to elevate hsTnI results.  Refer to the "Links" section for chest pain algorithms and additional  guidance. Performed at [facility], [address] [place], [place] [address]    CT Soft Tissue Neck W Contrast  Result Date: 10/13/2021 CLINICAL DATA:  Palate weakness, difficulty swallowing, evaluate for retropharyngeal abscess and lemierre's EXAM: CT NECK WITH CONTRAST TECHNIQUE: Multidetector CT imaging of the neck was performed using the standard protocol following the bolus administration of  intravenous contrast. CONTRAST:  57mL OMNIPAQUE IOHEXOL 300 MG/ML  SOLN COMPARISON:  None. FINDINGS: Pharynx and larynx: Normal. No mass or swelling. Salivary glands: No inflammation, mass, or stone. Thyroid: Normal. Lymph nodes: None enlarged or abnormal density. Vascular: Grossly patent arteries and veins. Limited intracranial: Negative. Visualized orbits: Negative. Mastoids and visualized paranasal sinuses: Clear. 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 No acute osseous abnormality. Upper chest: Focal pulmonary opacity. Other: None IMPRESSION: No acute process in the neck. No etiology is seen for the patient's difficulty swallowing. Electronically Signed   By: Wiliam Ke M.D.   On: 10/13/2021 15:05   DG Chest Port 1 View  Result Date: 10/13/2021 CLINICAL DATA:  Difficulty swallowing.  Short of breath. EXAM: PORTABLE CHEST 1 VIEW COMPARISON:  None. FINDINGS: Mild cardiac enlargement. Vascularity normal. Lungs are clear without infiltrate or effusion. IMPRESSION: No active disease. Electronically Signed   By: Marlan Palau M.D.   On: 10/13/2021 17:50    Review of Systems  Constitutional: Negative.   Respiratory:  Positive for choking.   Cardiovascular: Negative.   Gastrointestinal: Negative.    Blood pressure (!) 158/71, pulse 73, temperature 98 F (36.7 C), temperature source Oral, resp. rate 14, height 5\' 5"  (1.651 m), weight 72.6 kg, SpO2 99 %. Physical Exam Constitutional:      Appearance: He is well-developed.  HENT:     Head: Normocephalic and atraumatic.  Cardiovascular:     Rate and Rhythm: Normal rate and regular rhythm.  Pulmonary:     Effort: Pulmonary effort is normal.  Neurological:     General: No focal deficit present.     Mental Status: He is alert and oriented to person, place, and time.     Assessment/Plan Dysphagia Inability to tolerate  liquids with immediate regurgitation of fluids on swallowing since 7 PM yesterday suspicious for esophageal food bolus impaction EGD now, possible dilation if needed. The risks(bleeding, infection, perforation, anesthesia complications) and the benefits of the procedure were discussed with the patient in details using [employer] Spanish interpretation. Patient verbalized understanding and consents.  , MD 10/13/2021, 9:03 PM

## 2021-10-13 NOTE — ED Notes (Signed)
Upon entering room patient removing monitoring

## 2021-10-13 NOTE — ED Triage Notes (Signed)
Pt triage with spanish interpreter [facility] # [phone]  Pt arrives with reports of feeling SOB states that he felt like his throat was closing for about an hour prior to triage. Pt states that he was trying to eat and drink when he felt this way, feeling like he was choking. Pt speaking in full sentences and maintaining secretions. Pt asked about his high BP states that he is not on any medications for this at this time, is supposed to be on medication.

## 2021-10-13 NOTE — Transfer of Care (Signed)
Immediate Anesthesia Transfer of Care Note  Patient: Gary Richmond  Procedure(s) Performed: ESOPHAGOGASTRODUODENOSCOPY (EGD) WITH PROPOFOL  Patient Location: PACU  Anesthesia Type:General  Level of Consciousness: drowsy  Airway & Oxygen Therapy: Patient Spontanous Breathing  Post-op Assessment: Report given to RN and Post -op Vital signs reviewed and stable  Post vital signs: Reviewed and stable  Last Vitals:  Vitals Value Taken Time  BP    Temp    Pulse 76 10/13/21 2139  Resp 19 10/13/21 2139  SpO2 100 % 10/13/21 2139  Vitals shown include unvalidated device data.  Last Pain:  Vitals:   10/13/21 2048  TempSrc: Oral  PainSc: 0-No pain      Patients Stated Pain Goal: 0 (10/13/21 1930)  Complications: No notable events documented.

## 2021-10-13 NOTE — ED Notes (Signed)
Pt unable to swallow GI Cocktail. PA aware and states she will see him. Pt able to speak in full sentences.

## 2021-10-14 ENCOUNTER — Observation Stay (HOSPITAL_COMMUNITY): Payer: Self-pay

## 2021-10-14 ENCOUNTER — Encounter (HOSPITAL_COMMUNITY): Payer: Self-pay | Admitting: Internal Medicine

## 2021-10-14 ENCOUNTER — Inpatient Hospital Stay (HOSPITAL_COMMUNITY): Payer: Self-pay

## 2021-10-14 ENCOUNTER — Other Ambulatory Visit (HOSPITAL_BASED_OUTPATIENT_CLINIC_OR_DEPARTMENT_OTHER): Payer: Self-pay

## 2021-10-14 ENCOUNTER — Emergency Department (HOSPITAL_COMMUNITY): Payer: Self-pay

## 2021-10-14 DIAGNOSIS — R2990 Unspecified symptoms and signs involving the nervous system: Secondary | ICD-10-CM | POA: Diagnosis present

## 2021-10-14 DIAGNOSIS — I639 Cerebral infarction, unspecified: Secondary | ICD-10-CM

## 2021-10-14 DIAGNOSIS — I6389 Other cerebral infarction: Secondary | ICD-10-CM

## 2021-10-14 HISTORY — DX: Cerebral infarction, unspecified: I63.9

## 2021-10-14 LAB — ECHOCARDIOGRAM COMPLETE
AV Mean grad: 5 mmHg
AV Peak grad: 10.6 mmHg
Ao pk vel: 1.63 m/s
Area-P 1/2: 2.87 cm2
Height: 65 in
Weight: 2560 oz

## 2021-10-14 LAB — COMPREHENSIVE METABOLIC PANEL
ALT: 17 U/L (ref 0–44)
AST: 24 U/L (ref 15–41)
Albumin: 4.3 g/dL (ref 3.5–5.0)
Alkaline Phosphatase: 66 U/L (ref 38–126)
Anion gap: 6 (ref 5–15)
BUN: 18 mg/dL (ref 6–20)
CO2: 26 mmol/L (ref 22–32)
Calcium: 8.7 mg/dL — ABNORMAL LOW (ref 8.9–10.3)
Chloride: 103 mmol/L (ref 98–111)
Creatinine, Ser: 0.96 mg/dL (ref 0.61–1.24)
GFR, Estimated: >60 mL/min (ref >60)
Glucose, Bld: 140 mg/dL — ABNORMAL HIGH (ref 70–99)
Potassium: 4 mmol/L (ref 3.5–5.1)
Sodium: 135 mmol/L (ref 135–145)
Total Bilirubin: 1.9 mg/dL — ABNORMAL HIGH (ref 0.3–1.2)
Total Protein: 8 g/dL (ref 6.5–8.1)

## 2021-10-14 LAB — HIV ANTIBODY (ROUTINE TESTING W REFLEX): HIV Screen 4th Generation wRfx: NONREACTIVE

## 2021-10-14 LAB — MAGNESIUM: Magnesium: 2.3 mg/dL (ref 1.7–2.4)

## 2021-10-14 IMAGING — MR MR HEAD W/O CM
10 of 11 series · 42 of 48 positions shown · non-contrast
Comparison: Head CT [3A] hours today.
COMPARISON: Head CT [3A] hours today.

Addendum:
CLINICAL DATA: 55-year-old male with right side facial droop and
dysphagia.

EXAM:
MRI HEAD WITHOUT CONTRAST
TECHNIQUE: Multiplanar, multiecho pulse sequences of the brain and surrounding
structures were obtained without intravenous contrast.

[Series 5: DWI · axial · 3.0mm · 1.36mm/px · z∈[-34,+102]mm · 7 of 96 slices shown (1 of 2)]
[im 1/96]
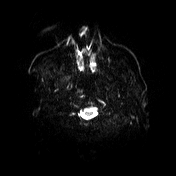
[im 16/96]
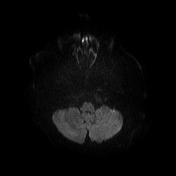
[im 32/96]
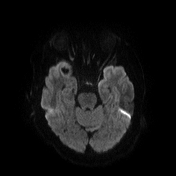
[im 48/96]
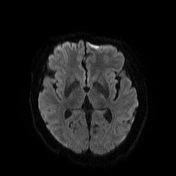
[im 64/96]
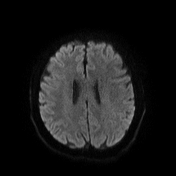
[im 80/96]
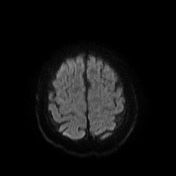
[im 96/96]
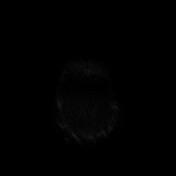

[Series 6: DWI · axial · 3.0mm · 1.36mm/px · z∈[-34,+102]mm · 3 of 48 slices shown (2 of 2)]
[im 1/48]
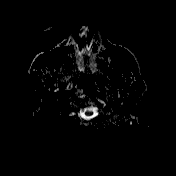
[im 24/48]
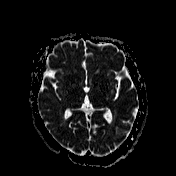
[im 48/48]
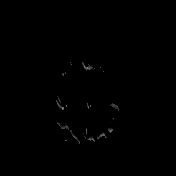

[Series 7: T1 · sagittal · 5.0mm · 0.75mm/px · 2 of 24 slices shown (1 of 2)]
[im 1/24]
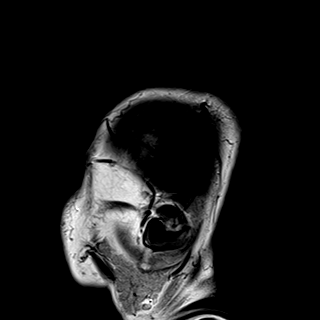
[im 24/24]
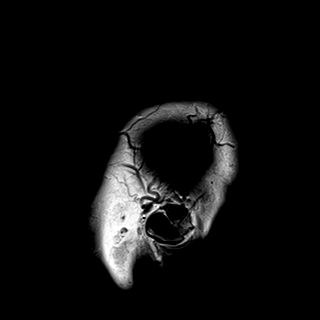

[Series 8: T2 · axial · 5.0mm · 0.62mm/px · z∈[-36,+107]mm · 2 of 24 slices shown (1 of 2)]
[im 1/24]
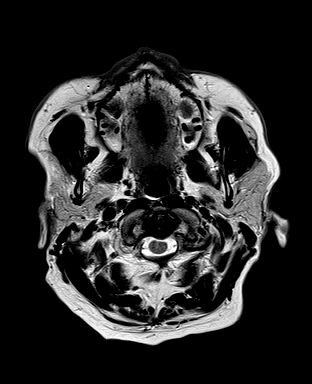
[im 24/24]
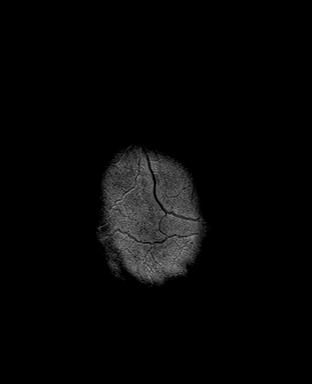

[Series 9: swi_images · axial · 3.0mm · 0.75mm/px · z∈[-38,+109]mm · 4 of 52 slices shown]
[im 1/52]
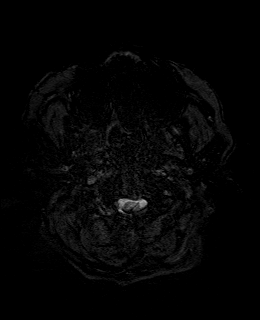
[im 18/52]
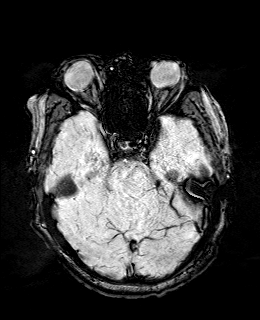
[im 35/52]
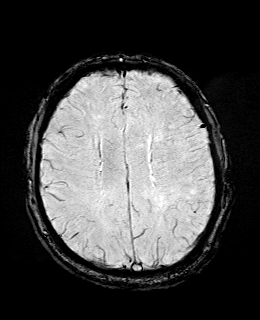
[im 52/52]
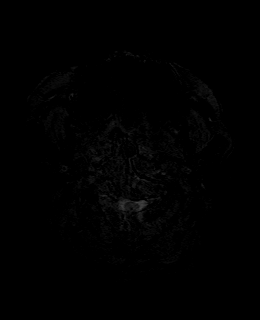

[Series 11: FLAIR · axial · 3.0mm · 0.75mm/px · z∈[-38,+109]mm · 4 of 52 slices shown (1 of 2)]
[im 1/52]
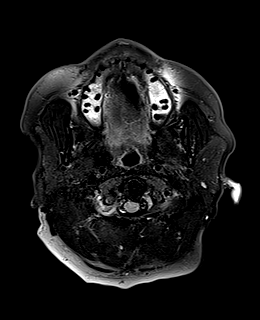
[im 18/52]
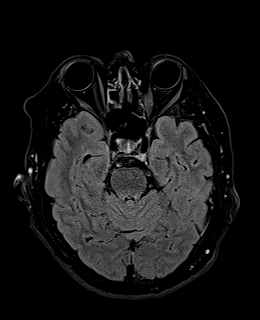
[im 35/52]
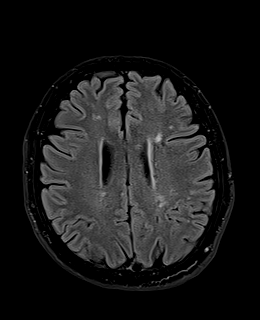
[im 52/52]
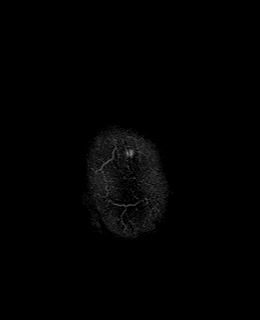

[Series 12: T1 · axial · 1.0mm · 0.94mm/px · z∈[-31,+106]mm · 8 of 144 slices shown (2 of 2)]
[im 1/144]
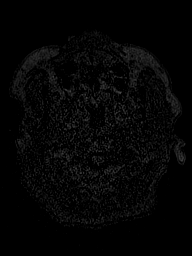
[im 16/144]
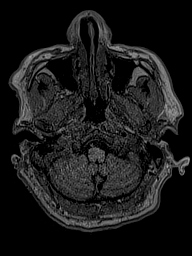
[im 48/144]
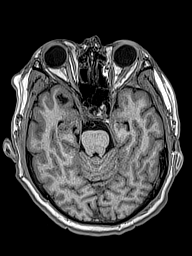
[im 64/144]
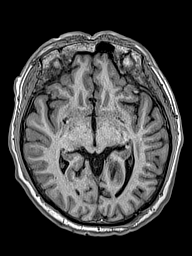
[im 80/144]
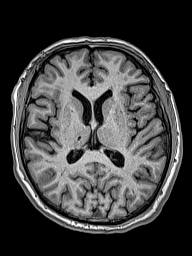
[im 96/144]
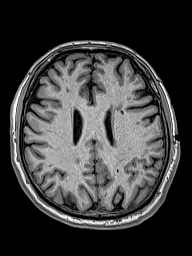
[im 128/144]
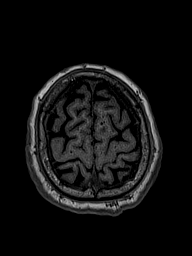
[im 144/144]
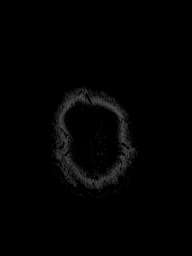

[Series 13: cor dwi_tracew · coronal · 5.0mm · 1.53mm/px · 1 of 48 slices shown]
[im 1/48]
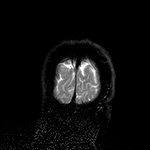

[Series 15: T2 · coronal · 5.0mm · 0.69mm/px · 2 of 30 slices shown (2 of 2)]
[im 1/30]
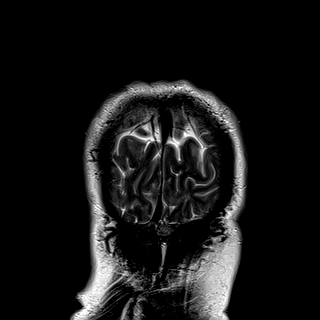
[im 30/30]
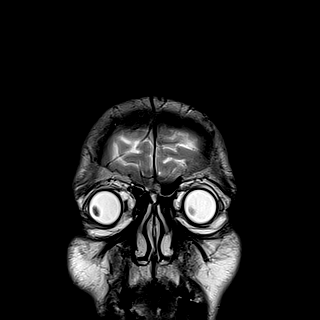

[Series 16: FLAIR · sagittal · 1.1mm · 0.50mm/px · 9 of 128 slices shown (2 of 2)]
[im 1/128]
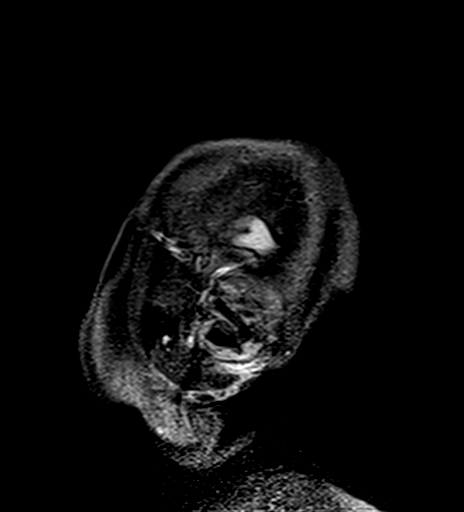
[im 16/128]
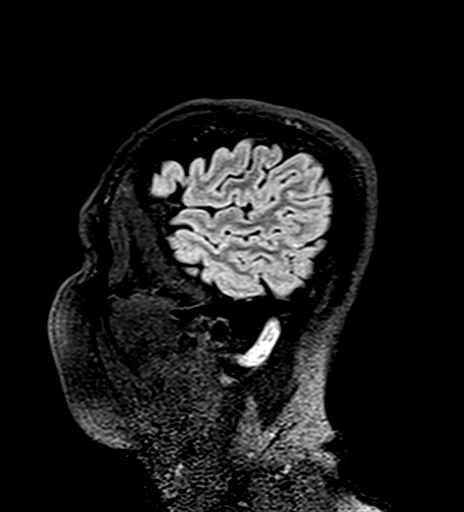
[im 32/128]
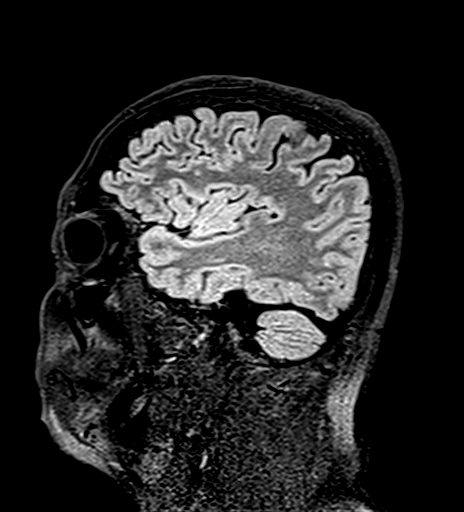
[im 48/128]
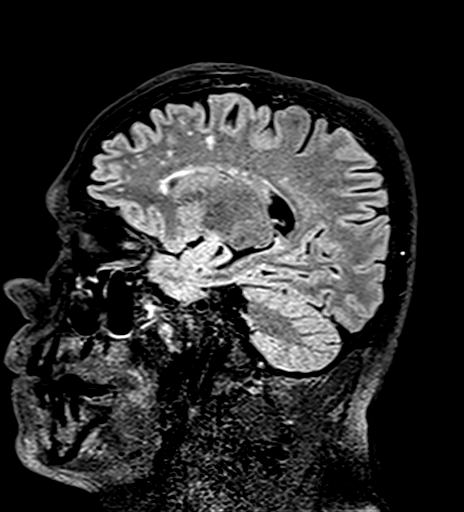
[im 64/128]
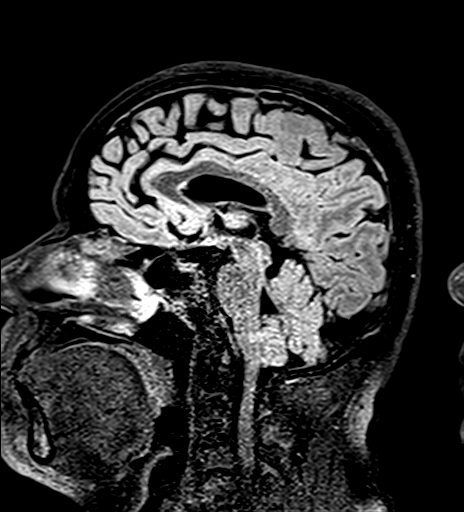
[im 80/128]
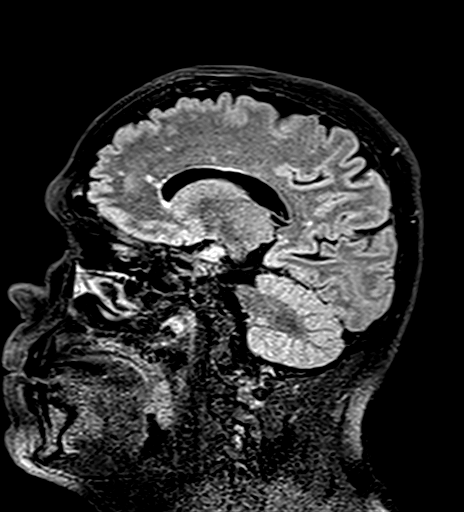
[im 96/128]
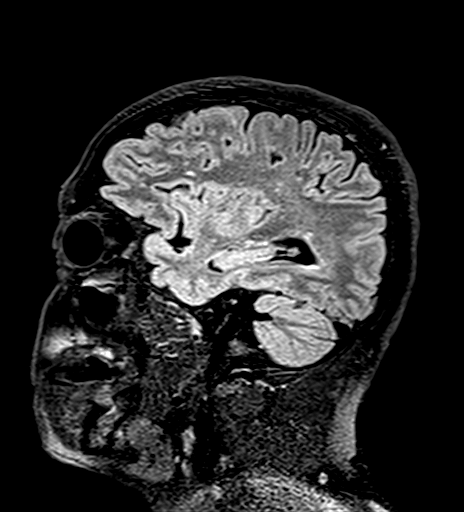
[im 112/128]
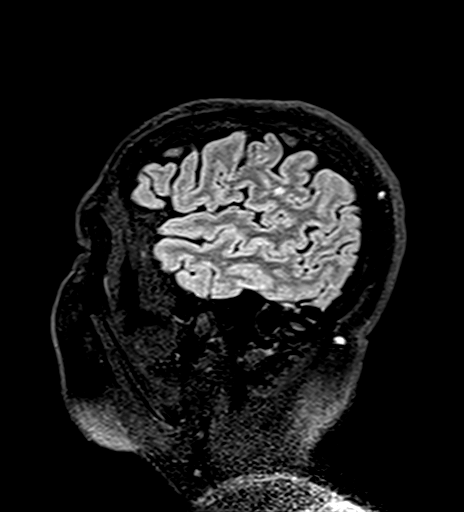
[im 128/128]
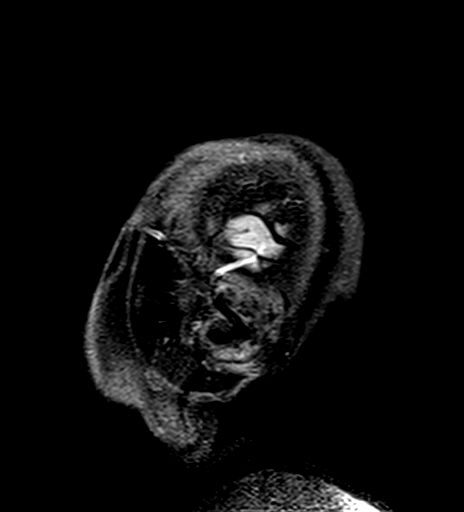

[42 of 48 positions shown; findings below may reference images not displayed]

FINDINGS: Brain: Linear roughly 8 mm area of restricted diffusion in the right
posterior and lateral medulla (series 5, images 54 and 55). Faint
associated T2 and FLAIR hyperintensity (series 11, image 9). No
hemorrhage or mass effect. No convincing cerebellar or other
posterior circulation diffusion restriction.

No superimposed supratentorial restricted diffusion. No midline
shift, mass effect, evidence of mass lesion, ventriculomegaly,
extra-axial collection or acute intracranial hemorrhage.
Cervicomedullary junction and pituitary are within normal limits.

-supratentorial scattered cerebral white matter T2 and FLAIR
hyperintensity is moderate for age and includes chronic lacunar
infarct of the anterior left corona radiata seen by CT.

There is also evidence of a chronic microhemorrhage at the right
mesial temporal lobe on SWI. And there is a chronic lacunar infarct
of the medial right thalamus also.

Vascular: Loss of the distal right vertebral artery flow void at the
skull base (series 8, image 1 and series 15, image 11, although
other Major intracranial vascular flow voids are preserved. And
there is conspicuous susceptibility artifact throughout the distal
right vertebral artery on SWI (series 9, image 5).

Skull and upper cervical spine: Negative. Visualized bone marrow
signal is within normal limits.

Sinuses/Orbits: Rightward gaze.  Otherwise negative.

Other: Negative orbit and scalp soft tissues.
IMPRESSION: 1. Strong evidence of Occluded Distal Right Vertebral Artery, and
associated small Acute Right Medullary Infarct. No associated
hemorrhage or mass effect.

2. Supratentorial chronic small vessel disease including lacunar
infarcts of the left corona radiata and right thalamus.

ADDENDUM:
Study discussed by telephone with Dr. CHAHMA on [DATE] at
[3A] hours.

*** End of Addendum ***
FINDINGS: Brain: Linear roughly 8 mm area of restricted diffusion in the right
posterior and lateral medulla (series 5, images 54 and 55). Faint
associated T2 and FLAIR hyperintensity (series 11, image 9). No
hemorrhage or mass effect. No convincing cerebellar or other
posterior circulation diffusion restriction.

No superimposed supratentorial restricted diffusion. No midline
shift, mass effect, evidence of mass lesion, ventriculomegaly,
extra-axial collection or acute intracranial hemorrhage.
Cervicomedullary junction and pituitary are within normal limits.

-supratentorial scattered cerebral white matter T2 and FLAIR
hyperintensity is moderate for age and includes chronic lacunar
infarct of the anterior left corona radiata seen by CT.

There is also evidence of a chronic microhemorrhage at the right
mesial temporal lobe on SWI. And there is a chronic lacunar infarct
of the medial right thalamus also.

Vascular: Loss of the distal right vertebral artery flow void at the
skull base (series 8, image 1 and series 15, image 11, although
other Major intracranial vascular flow voids are preserved. And
there is conspicuous susceptibility artifact throughout the distal
right vertebral artery on SWI (series 9, image 5).

Skull and upper cervical spine: Negative. Visualized bone marrow
signal is within normal limits.

Sinuses/Orbits: Rightward gaze.  Otherwise negative.

Other: Negative orbit and scalp soft tissues.
IMPRESSION: 1. Strong evidence of Occluded Distal Right Vertebral Artery, and
associated small Acute Right Medullary Infarct. No associated
hemorrhage or mass effect.

2. Supratentorial chronic small vessel disease including lacunar
infarcts of the left corona radiata and right thalamus.

## 2021-10-14 IMAGING — CT CT HEAD W/O CM
3 series · 16 of 47 positions shown, 19 images · non-contrast
Comparison: None.

CLINICAL DATA: Mental status change.

EXAM:
CT HEAD WITHOUT CONTRAST
TECHNIQUE: Contiguous axial images were obtained from the base of the skull
through the vertex without intravenous contrast.

[Series 2: head wo · axial · 0.47mm/px · z∈[+1415,+1545]mm · 10 of 32 slices shown, 13 images]
[im 3/32  brain]
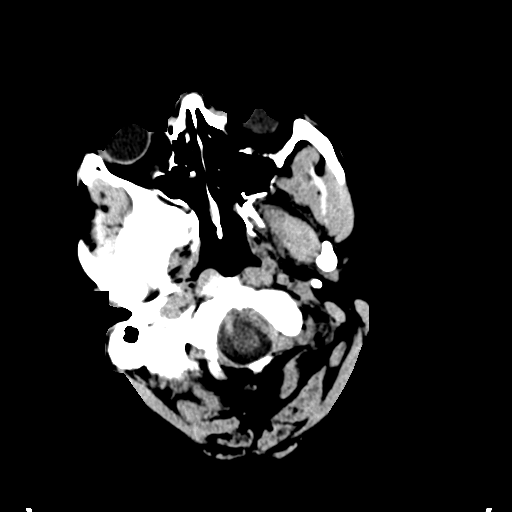
[im 3/32  bone]
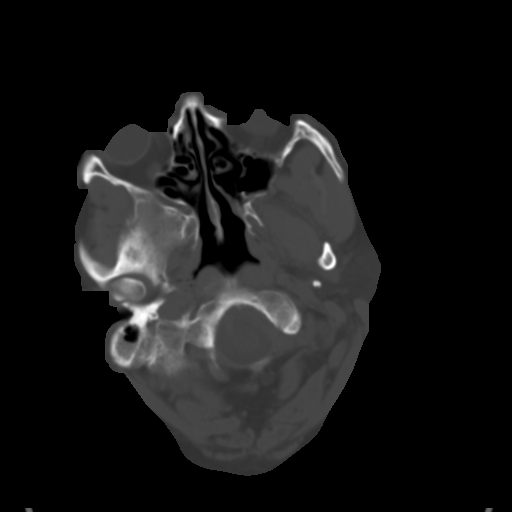
[im 6/32  brain]
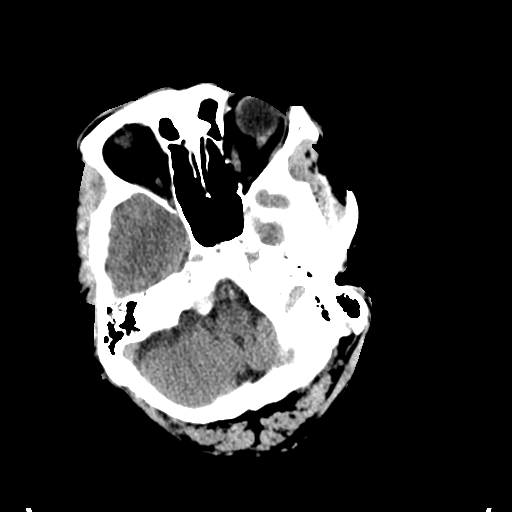
[im 9/32  brain]
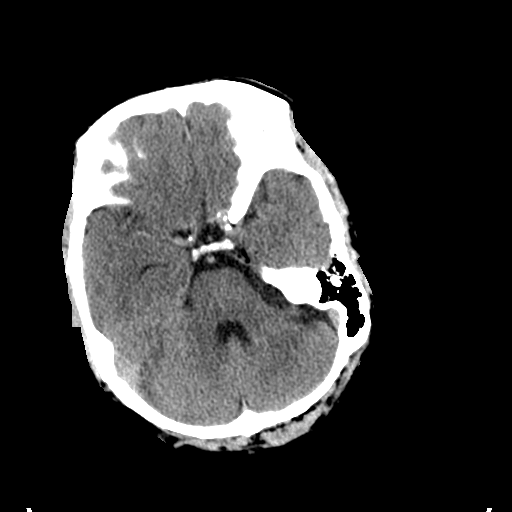
[im 11/32  brain]
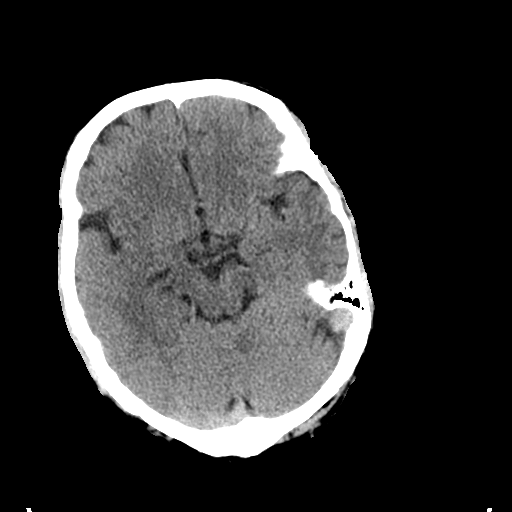
[im 14/32  brain]
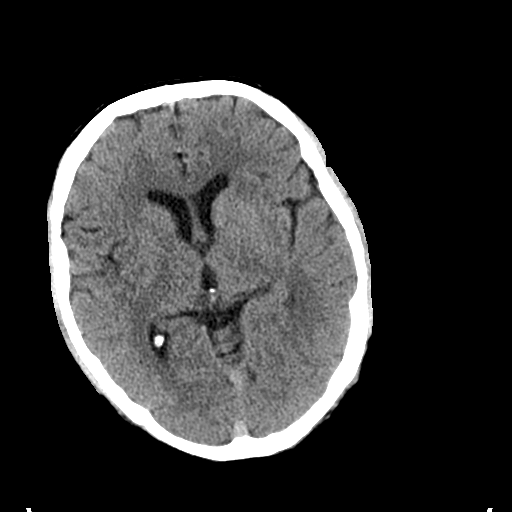
[im 14/32  bone]
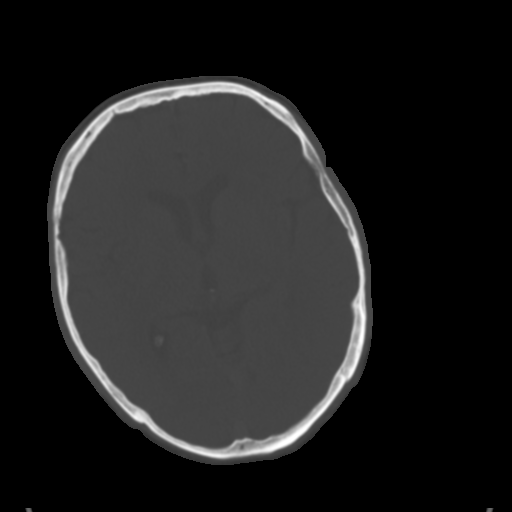
[im 18/32  brain]
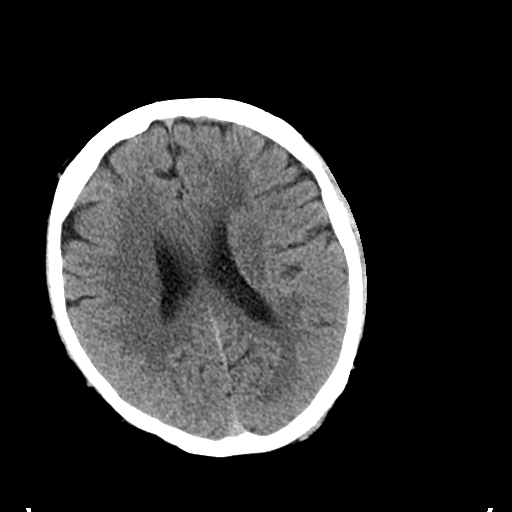
[im 21/32  brain]
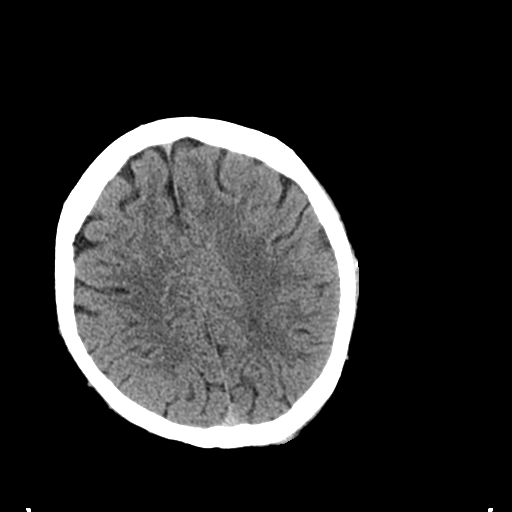
[im 24/32  brain]
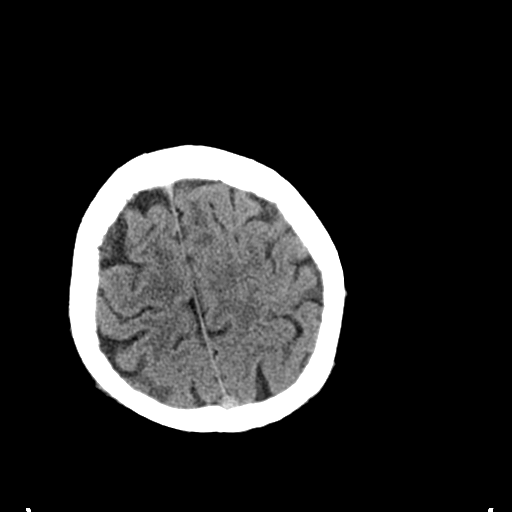
[im 26/32  brain]
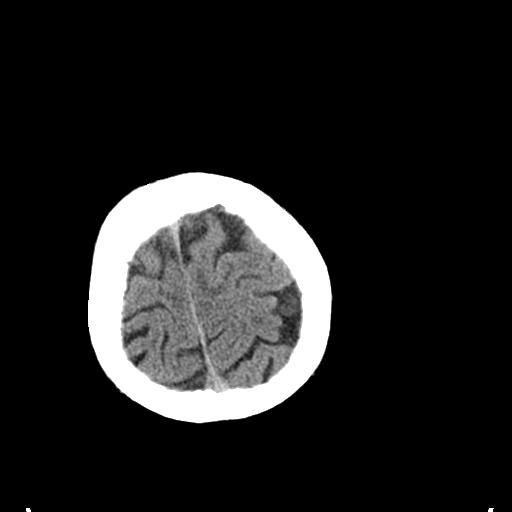
[im 26/32  bone]
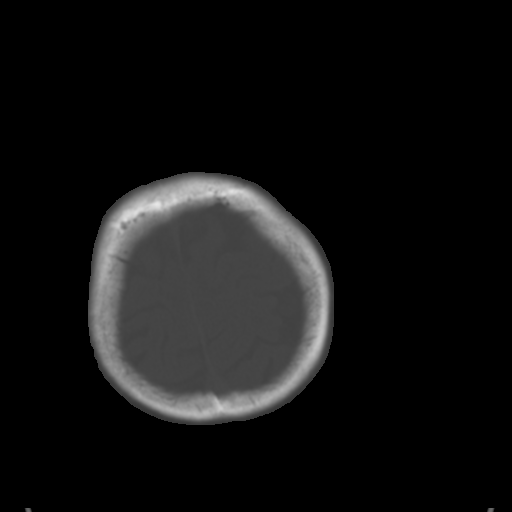
[im 29/32  brain]
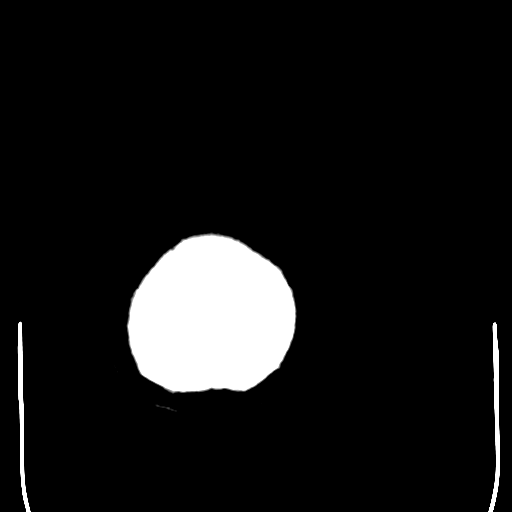

[Series 4: coronal soft tissue · coronal · 0.30mm/px · 3 of 63 slices shown]
[im 21/63  brain]
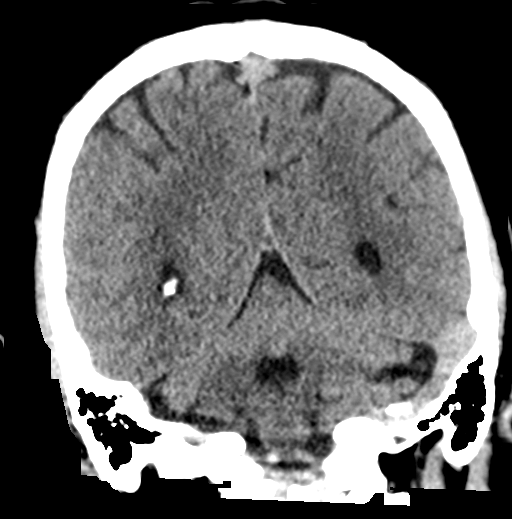
[im 28/63  brain]
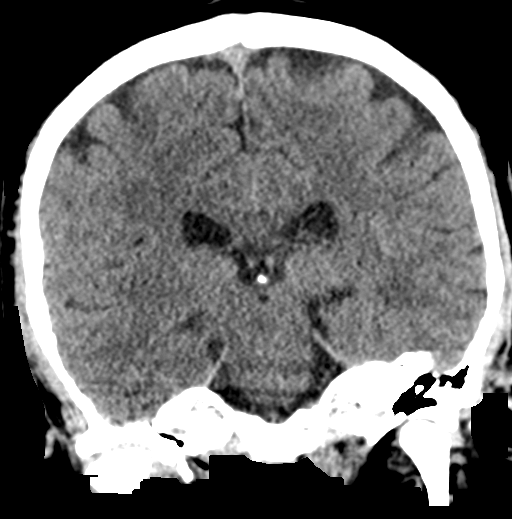
[im 35/63  brain]
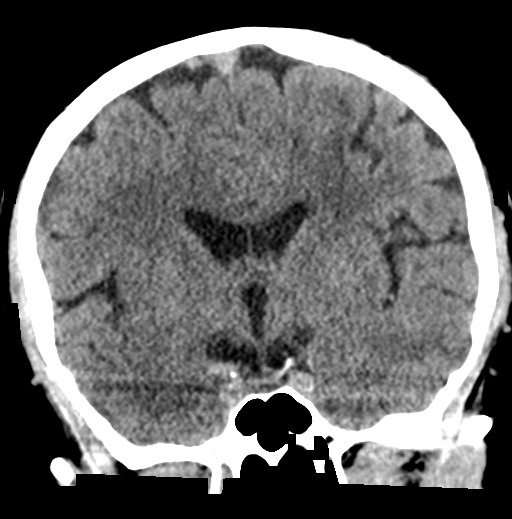

[Series 5: sagittal soft tissue · sagittal · 0.31mm/px · 3 of 52 slices shown]
[im 18/52  brain]
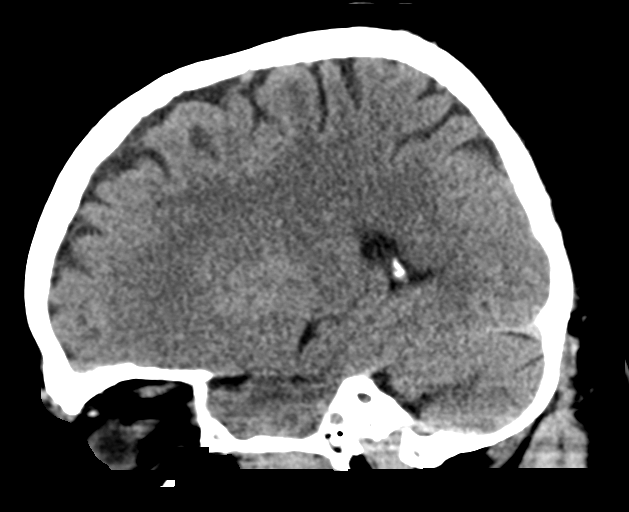
[im 26/52  brain]
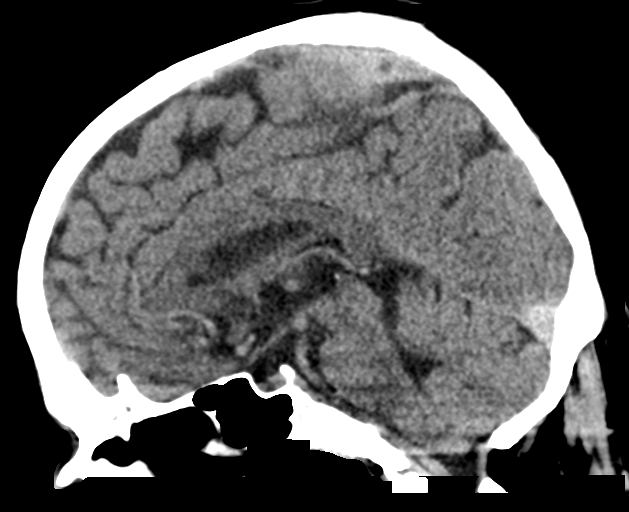
[im 35/52  brain]
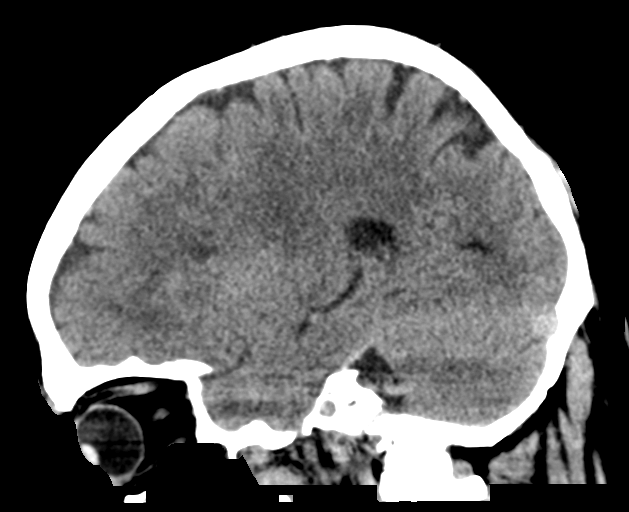

[16 of 47 positions shown; findings below may reference images not displayed]

FINDINGS: Brain: No acute intracranial hemorrhage, midline shift or mass
effect. No extra-axial fluid collection. Subcortical and
periventricular white matter hypodensities are noted bilaterally.
There is no hydrocephalus.

Vascular: Atherosclerotic calcification of the carotid siphons. No
hyperdense vessel.

Skull: Normal. Negative for fracture or focal lesion.

Sinuses/Orbits: No acute finding.

Other: None.
IMPRESSION: 1. No acute intracranial process.
2. Subcortical and periventricular white matter hypodensities
bilaterally suggesting chronic microvascular ischemic changes. If
there is clinical concern for acute infarct, MRI is suggested for
follow-up.

## 2021-10-14 IMAGING — MR MR CERVICAL SPINE W/O CM
4 of 6 series · 31 of 48 positions shown · non-contrast
Comparison: Same-day CTA neck

CLINICAL DATA: Spinal stenosis

EXAM:
MRI CERVICAL SPINE WITHOUT CONTRAST
TECHNIQUE: Multiplanar, multisequence MR imaging of the cervical spine was
performed. No intravenous contrast was administered.

[Series 5: T1 · sagittal · 3.0mm · 0.69mm/px · 5 of 15 slices shown (1 of 2)]
[im 1/15]
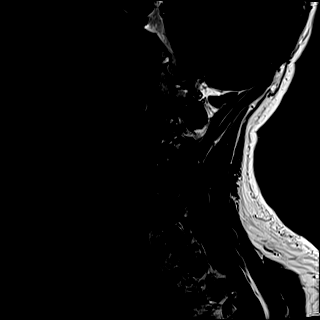
[im 4/15]
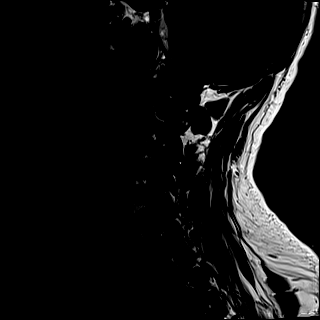
[im 8/15]
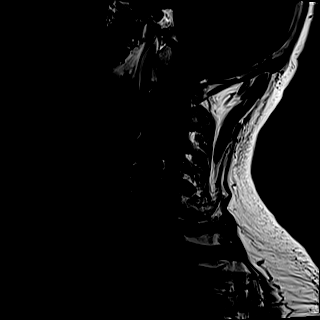
[im 11/15]
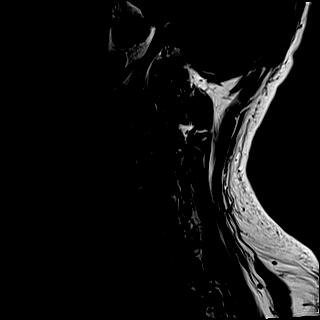
[im 15/15]
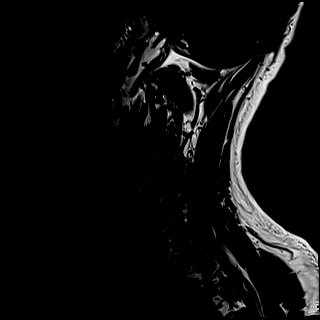

[Series 6: T2 · sagittal · 3.0mm · 0.69mm/px · 5 of 15 slices shown (1 of 2)]
[im 1/15]
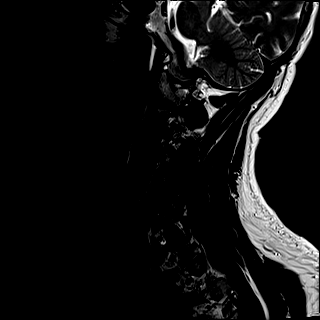
[im 4/15]
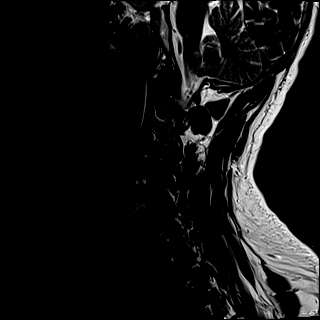
[im 8/15]
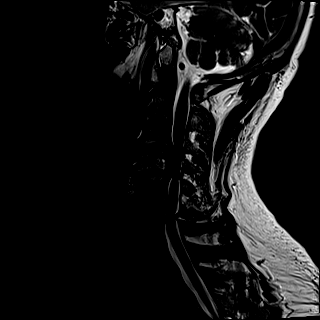
[im 11/15]
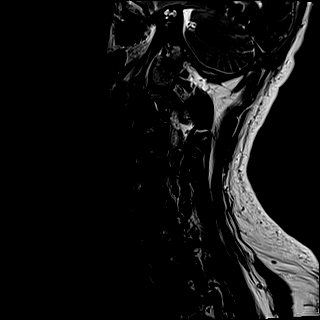
[im 15/15]
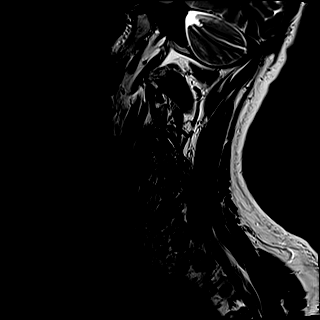

[Series 8: T2 · axial · 3.0mm · 0.70mm/px · z∈[-34,+67]mm · 11 of 30 slices shown (2 of 2)]
[im 1/30]
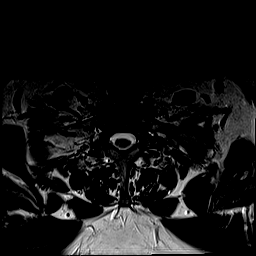
[im 3/30]
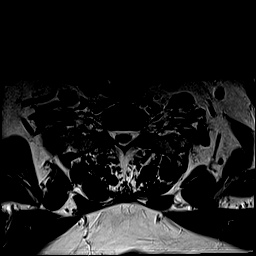
[im 6/30]
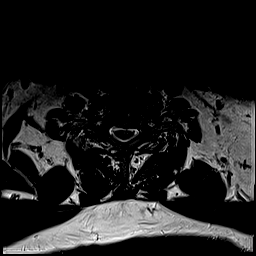
[im 9/30]
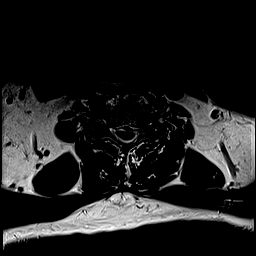
[im 12/30]
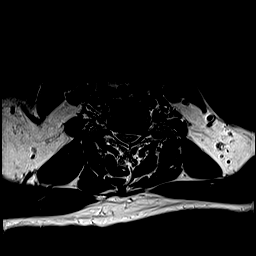
[im 15/30]
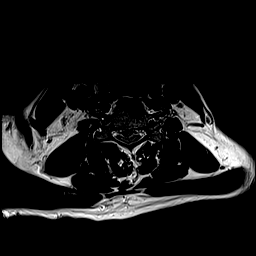
[im 18/30]
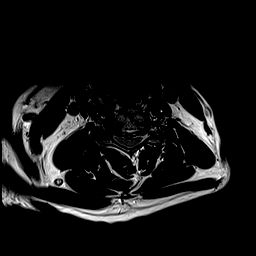
[im 21/30]
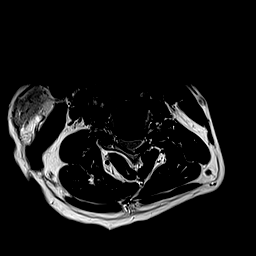
[im 24/30]
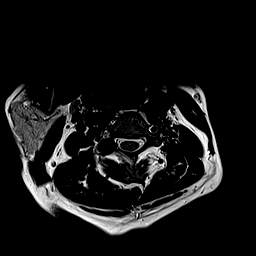
[im 27/30]
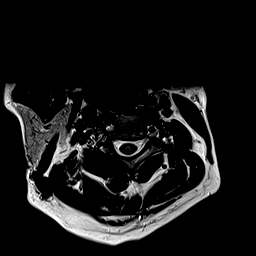
[im 30/30]
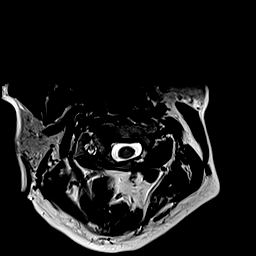

[Series 10: T1 · axial · 3.0mm · 0.35mm/px · z∈[-34,+57]mm · 10 of 30 slices shown (2 of 2)]
[im 1/30]
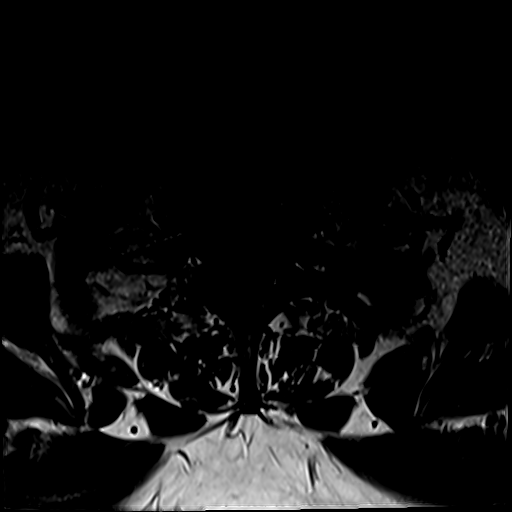
[im 3/30]
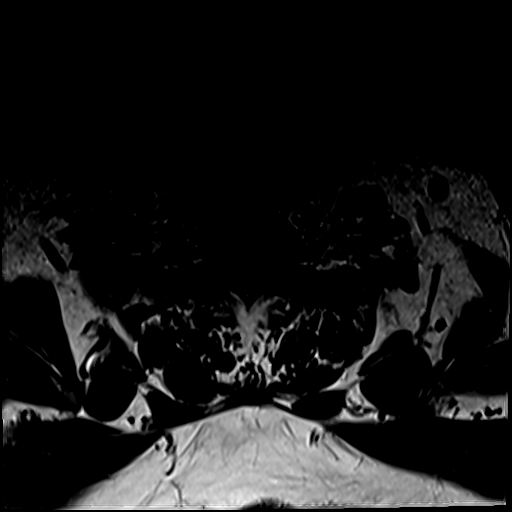
[im 6/30]
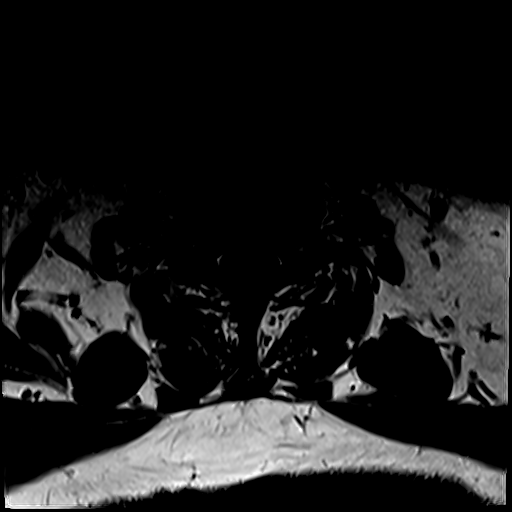
[im 9/30]
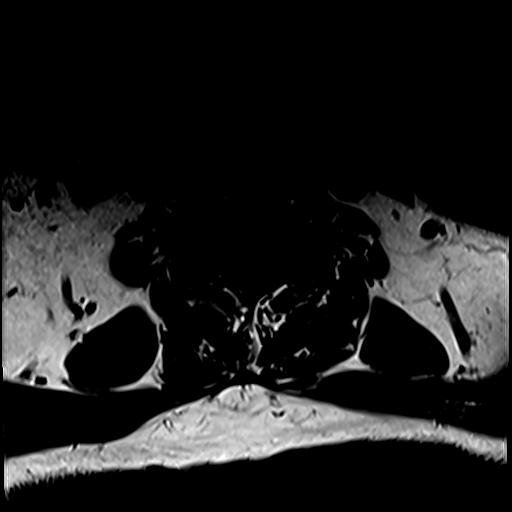
[im 12/30]
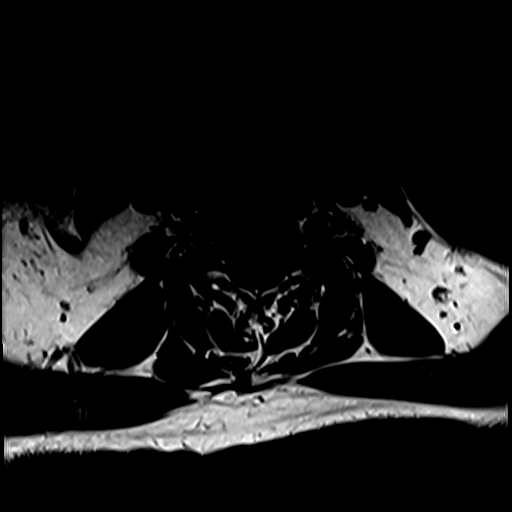
[im 15/30]
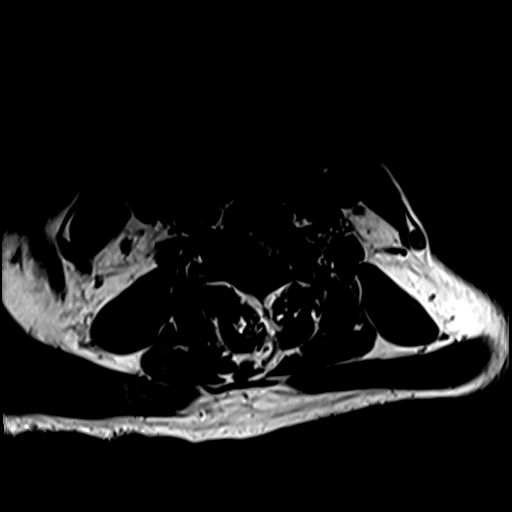
[im 18/30]
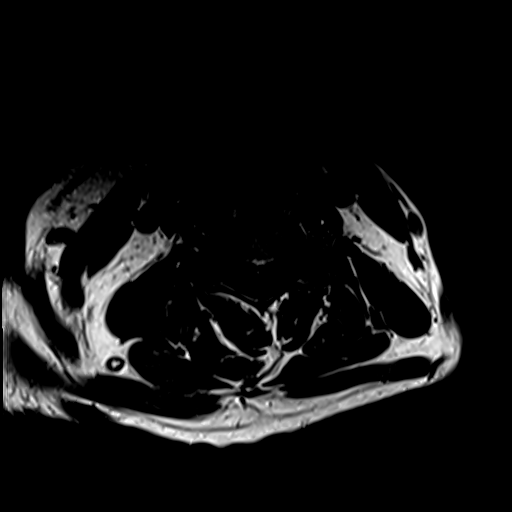
[im 21/30]
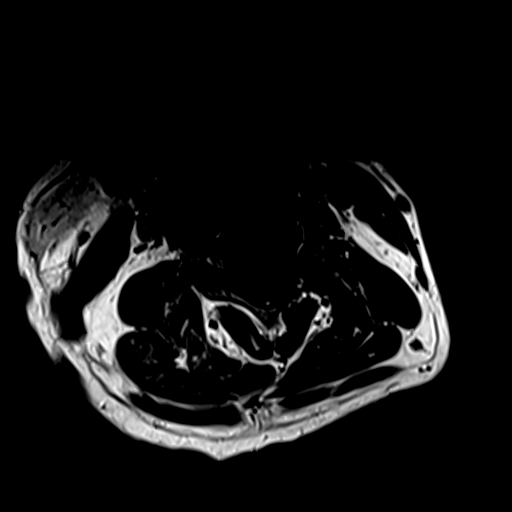
[im 24/30]
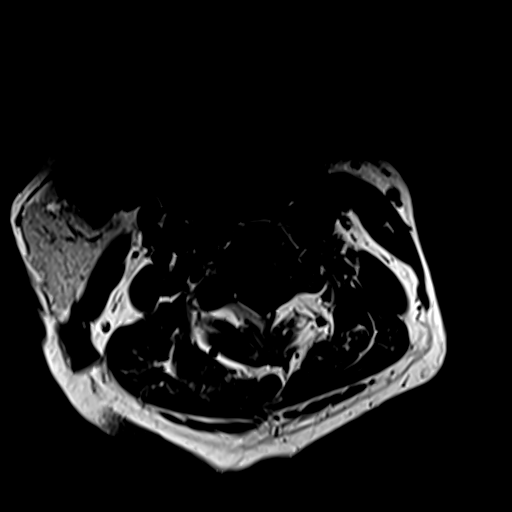
[im 27/30]
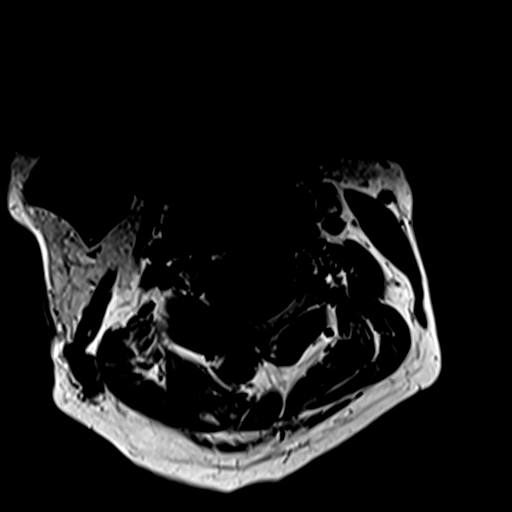

[31 of 48 positions shown; findings below may reference images not displayed]

FINDINGS: Alignment: Normal.

Vertebrae: Vertebral body heights are preserved. There is
degenerative endplate marrow signal abnormality at C3-C4 and C4-C5.
There is no suspicious marrow signal abnormality.

Cord: The cord is compressed at C3-C4 and C4-C5. There is possible
cord signal abnormality within the right aspect of the cord at C4
(8-12) and centrally at C4-C5 (6-8, 8-16).

Posterior Fossa, vertebral arteries, paraspinal tissues: There is T2
hyperintensity in the right aspect of the medulla consistent with
evolving infarct as seen on the prior brain MRI. The posterior fossa
is assessed on the separately dictated brain MRI and head CT. The
right vertebral artery flow void is attenuated in the upper neck,
better evaluated on the prior CTA. The left vertebral artery flow
void is present. The paraspinal soft tissues are unremarkable.

Disc levels:

There is multilevel disc desiccation and narrowing, most advanced at
C4-C5.

C2-C3: No significant spinal canal or neural foraminal stenosis.

C3-C4: There is a broad-based posterior disc osteophyte complex and
uncovertebral and bilateral facet arthropathy resulting in moderate
to severe spinal canal stenosis with cord compression and possible
cord signal abnormality and moderate to severe right and mild left
neural foraminal stenosis.

C4-C5: There is ossification of the posterior longitudinal ligament
along the posterior C4 endplate as seen on prior CT, uncovertebral
and facet arthropathy, and a prominent central protrusion resulting
in severe spinal canal stenosis with cord compression and severe
right and moderate left neural foraminal stenosis.

C5-C6: There is a posterior disc osteophyte complex with a small
central annular fissure and uncovertebral and facet arthropathy
resulting in mild spinal canal stenosis and severe right worse than
left neural foraminal stenosis.

C6-C7: There is a posterior disc osteophyte complex, prominent
ligamentum flavum thickening, and uncovertebral and facet
arthropathy resulting in moderate spinal canal stenosis with
effacement of the ventral thecal sac and severe bilateral neural
foraminal stenosis.

C7-T1: No significant spinal canal or neural foraminal stenosis.
IMPRESSION: 1. Multilevel degenerative changes throughout the cervical spine as
detailed above resulting in moderate to severe spinal canal stenosis
at C3-C4, severe spinal canal stenosis at C4-C5, and moderate spinal
canal stenosis at C6-C7 with cord compression worst at C4-C5.
2. Suspected cord signal abnormality in the right aspect of the cord
at C4 and centrally at C4-C5 suspicious for compressive myelopathy.
3. Multilevel neural foraminal stenosis, moderate to severe on the
right at C3-C4, severe on the right at C4-C5, and severe bilaterally
at C5-C6 and C6-C7.

## 2021-10-14 MED ORDER — ACETAMINOPHEN 325 MG PO TABS: 650.0000 mg | ORAL_TABLET | ORAL | Status: DC | PRN

## 2021-10-14 MED ORDER — IOHEXOL 350 MG/ML SOLN
100.0000 mL | Freq: Once | INTRAVENOUS | Status: AC | PRN
  Administered 2021-10-14: 100 mL via INTRAVENOUS

## 2021-10-14 MED ORDER — ONDANSETRON HCL 4 MG/2ML IJ SOLN
4.0000 mg | Freq: Once | INTRAMUSCULAR | Status: AC
  Administered 2021-10-14: 4 mg via INTRAVENOUS
  Filled 2021-10-14: qty 2

## 2021-10-14 MED ORDER — SODIUM CHLORIDE (PF) 0.9 % IJ SOLN
INTRAMUSCULAR | Status: AC
  Filled 2021-10-14: qty 50

## 2021-10-14 MED ORDER — STROKE: EARLY STAGES OF RECOVERY BOOK
Freq: Once | Status: AC
  Filled 2021-10-14 (×2): qty 1

## 2021-10-14 MED ORDER — ACETAMINOPHEN 160 MG/5ML PO SOLN: 650.0000 mg | ORAL | Status: DC | PRN

## 2021-10-14 MED ORDER — PERFLUTREN LIPID MICROSPHERE
1.0000 mL | INTRAVENOUS | Status: AC | PRN
  Administered 2021-10-14: 2 mL via INTRAVENOUS
  Filled 2021-10-14: qty 10

## 2021-10-14 MED ORDER — ACETAMINOPHEN 650 MG RE SUPP: 650.0000 mg | RECTAL | Status: DC | PRN

## 2021-10-14 NOTE — ED Provider Notes (Addendum)
Patient transferred from [facility] for endoscopy for esophageal food impaction and difficulty swallowing.  Pt returned from endoscopy, no esophageal FB identified.  He continues to complain of difficulty swallowing.  Full history was obtained from the patient using language interpreter for reassessment.  Patient states that he has had difficulty swallowing since 11 AM this morning.  His boss brought him in from work.  He has associated throat discomfort.  No fevers, vomiting, diarrhea.  He was in his routine state of health prior to this occurring.  On examination he does have mild right upper facial weakness.  Difficult to assess lower facial weakness as patient has difficulty with this task.  He has 5 out of 5 strength in bilateral upper and lower extremities.  He does seem somewhat confused about following certain commands but this is through an interpreter and there is a language barrier.  Discussed with neurologist on-call, recommends MRI to evaluate for CVA.  Discussed with hospitalist admission for ongoing treatment.  MRI returned concerning for acute CVA.  With this additional information a repeat neurologic exam was performed, similar to initial exam.  Code stroke was activated with this additional information.  Discussed with hospitalist change in treatment plan.   Tilden Fossa, MD 10/14/21 [place]    Tilden Fossa, MD 10/14/21 [phone]   CRITICAL CARE Performed by: Tilden Fossa   Total critical care time: 45 minutes  Critical care time was exclusive of separately billable procedures and treating other patients.  Critical care was necessary to treat or prevent imminent or life-threatening deterioration.  Critical care was time spent personally by me on the following activities: development of treatment plan with patient and/or surrogate as well as nursing, discussions with consultants, evaluation of patient's response to treatment, examination of patient, obtaining history from patient  or surrogate, ordering and performing treatments and interventions, ordering and review of laboratory studies, ordering and review of radiographic studies, pulse oximetry and re-evaluation of patient's condition.    Tilden Fossa, MD 10/14/21 [phone]

## 2021-10-14 NOTE — H&P (Signed)
History and Physical    Gary Richmond [id number] DOB: Apr 09, 1966 DOA: 10/13/2021  PCP: Pcp, No  Patient coming from: Home  Chief Complaint: dysphagia  HPI: Gary Richmond is a 56 y.o. male with medical history significant of HTN. Presenting with dysphagia. He was in his normal stat of health until 2 days ago. That evening he noted that he had some difficulty swallowing liquids. He didn't seem to have difficulty with anything prior to then. He denies any other symptoms. He became concerned and game to the ED.  He denies any other aggravating or alleviating factors.   ED Course: He was evaluated with EGD by [facility]. He was found to have a normal esophagus. While waiting in the ED, he was noted to have some facial weakness. A CODE stroke was called. TRH was called for admission.  Review of Systems:  Denies CP, palpitations, dyspnea, abdominal pain, N/D, fevers, sick contacts. Reports some vomiting after trying to drink liquids Review of systems is otherwise negative for all not mentioned in HPI.   PMHx Past Medical History:  Diagnosis Date   Hypertension     PSHx History reviewed. No pertinent surgical history.  SocHx  reports that he has never smoked. He has never used smokeless tobacco. He reports current alcohol use. He reports that he does not use drugs.  No Known Allergies  FamHx History reviewed. No pertinent family history.  Prior to Admission medications   Not on File    Physical Exam: Vitals:   10/14/21 0400 10/14/21 0430 10/14/21 0545 10/14/21 0815  BP: (!) 154/103 (!) 154/90 (!) 149/87 (!) 185/102  Pulse: 78 80 91 91  Resp: 13 14 20  (!) 25  Temp:      TempSrc:      SpO2: 94% 90% 93% 94%  Weight:      Height:        General: 56 y.o. male resting in bed in NAD Eyes: PERRL, normal sclera ENMT: Nares patent w/o discharge, orophaynx clear, dentition normal, ears w/o discharge/lesions/ulcers Neck: Supple, trachea midline Cardiovascular:  RRR, +S1, S2, no m/g/r, equal pulses throughout Respiratory: CTABL, no w/r/r, normal WOB GI: BS+, NDNT, no masses noted, no organomegaly noted MSK: No e/c/c Neuro: A&O x 3, having difficulty following commands (interpreter is assisting); BUE/BLE msk str 5/5 Psyc: Appropriate interaction and affect, calm/cooperative  Labs on Admission: I have personally reviewed following labs and imaging studies  CBC: Recent Labs  Lab 10/13/21 1228  WBC 7.3  NEUTROABS 5.5  HGB 15.7  HCT 45.2  MCV 90.0  PLT 274   Basic Metabolic Panel: Recent Labs  Lab 10/13/21 1334  NA 136  K 3.3*  CL 96*  CO2 28  GLUCOSE 121*  BUN 10  CREATININE 0.81  CALCIUM 9.2   GFR: Estimated Creatinine Clearance: 88.6 mL/min (by C-G formula based on SCr of 0.81 mg/dL). Liver Function Tests: Recent Labs  Lab 10/13/21 1334  AST 26  ALT 21  ALKPHOS 80  BILITOT 1.2  PROT 8.8*  ALBUMIN 4.8   No results for input(s): LIPASE, AMYLASE in the last 168 hours. No results for input(s): AMMONIA in the last 168 hours. Coagulation Profile: No results for input(s): INR, PROTIME in the last 168 hours. Cardiac Enzymes: No results for input(s): CKTOTAL, CKMB, CKMBINDEX, TROPONINI in the last 168 hours. BNP (last 3 results) No results for input(s): PROBNP in the last 8760 hours. HbA1C: No results for input(s): HGBA1C in the last 72 hours. CBG: No results  for input(s): GLUCAP in the last 168 hours. Lipid Profile: No results for input(s): CHOL, HDL, LDLCALC, TRIG, CHOLHDL, LDLDIRECT in the last 72 hours. Thyroid Function Tests: No results for input(s): TSH, T4TOTAL, FREET4, T3FREE, THYROIDAB in the last 72 hours. Anemia Panel: No results for input(s): VITAMINB12, FOLATE, FERRITIN, TIBC, IRON, RETICCTPCT in the last 72 hours. Urine analysis: No results found for: COLORURINE, APPEARANCEUR, LABSPEC, PHURINE, GLUCOSEU, HGBUR, BILIRUBINUR, KETONESUR, PROTEINUR, UROBILINOGEN, NITRITE, LEUKOCYTESUR  Radiological Exams on  Admission: CT Head Wo Contrast  Result Date: 10/14/2021 CLINICAL DATA:  Mental status change. EXAM: CT HEAD WITHOUT CONTRAST TECHNIQUE: Contiguous axial images were obtained from the base of the skull through the vertex without intravenous contrast. COMPARISON:  None. FINDINGS: Brain: No acute intracranial hemorrhage, midline shift or mass effect. No extra-axial fluid collection. Subcortical and periventricular white matter hypodensities are noted bilaterally. There is no hydrocephalus. Vascular: Atherosclerotic calcification of the carotid siphons. No hyperdense vessel. Skull: Normal. Negative for fracture or focal lesion. Sinuses/Orbits: No acute finding. Other: None. IMPRESSION: 1. No acute intracranial process. 2. Subcortical and periventricular white matter hypodensities bilaterally suggesting chronic microvascular ischemic changes. If there is clinical concern for acute infarct, MRI is suggested for follow-up. Electronically Signed   By: Thornell SartoriusLaura  Taylor M.D.   On: 10/14/2021 01:39   CT Soft Tissue Neck W Contrast  Result Date: 10/13/2021 CLINICAL DATA:  Palate weakness, difficulty swallowing, evaluate for retropharyngeal abscess and lemierre's EXAM: CT NECK WITH CONTRAST TECHNIQUE: Multidetector CT imaging of the neck was performed using the standard protocol following the bolus administration of intravenous contrast. CONTRAST:  75mL OMNIPAQUE IOHEXOL 300 MG/ML  SOLN COMPARISON:  None. FINDINGS: Pharynx and larynx: Normal. No mass or swelling. Salivary glands: No inflammation, mass, or stone. Thyroid: Normal. Lymph nodes: None enlarged or abnormal density. Vascular: Grossly patent arteries and veins. Limited intracranial: Negative. Visualized orbits: Negative. Mastoids and visualized paranasal sinuses: Clear. 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 No acute osseous abnormality.  Upper chest: Focal pulmonary opacity. Other: None IMPRESSION: No acute process in the neck. No etiology is seen for the patient's difficulty swallowing. Electronically Signed   By: Wiliam KeAlison  Vasan M.D.   On: 10/13/2021 15:05   MR BRAIN WO CONTRAST  Addendum Date: 10/14/2021   ADDENDUM REPORT: 10/14/2021 07:23 ADDENDUM: Study discussed by telephone with Dr. Tilden FossaELIZABETH REES on 10/14/2021 at 0712 hours. Electronically Signed   By: Odessa FlemingH  Hall M.D.   On: 10/14/2021 07:23   Result Date: 10/14/2021 CLINICAL DATA:  56 year old male with right side facial droop and dysphagia. EXAM: MRI HEAD WITHOUT CONTRAST TECHNIQUE: Multiplanar, multiecho pulse sequences of the brain and surrounding structures were obtained without intravenous contrast. COMPARISON:  Head CT 0129 hours today. FINDINGS: Brain: Linear roughly 8 mm area of restricted diffusion in the right posterior and lateral medulla (series 5, images 54 and 55). Faint associated T2 and FLAIR hyperintensity (series 11, image 9). No hemorrhage or mass effect. No convincing cerebellar or other posterior circulation diffusion restriction. No superimposed supratentorial restricted diffusion. No midline shift, mass effect, evidence of mass lesion, ventriculomegaly, extra-axial collection or acute intracranial hemorrhage. Cervicomedullary junction and pituitary are within normal limits. -supratentorial scattered cerebral white matter T2 and FLAIR hyperintensity is moderate for age and includes chronic lacunar infarct of the anterior left corona radiata seen by CT. There is also evidence of a chronic microhemorrhage at the right mesial temporal lobe on SWI. And  there is a chronic lacunar infarct of the medial right thalamus also. Vascular: Loss of the distal right vertebral artery flow void at the skull base (series 8, image 1 and series 15, image 11, although other Major intracranial vascular flow voids are preserved. And there is conspicuous susceptibility artifact throughout the  distal right vertebral artery on SWI (series 9, image 5). Skull and upper cervical spine: Negative. Visualized bone marrow signal is within normal limits. Sinuses/Orbits: Rightward gaze.  Otherwise negative. Other: Negative orbit and scalp soft tissues. IMPRESSION: 1. 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 No associated hemorrhage or mass effect. 2. 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 Electronically Signed: By: Odessa Fleming M.D. On: 10/14/2021 07:09   DG Chest Port 1 View  Result Date: 10/13/2021 CLINICAL DATA:  Difficulty swallowing.  Short of breath. EXAM: PORTABLE CHEST 1 VIEW COMPARISON:  None. FINDINGS: Mild cardiac enlargement. Vascularity normal. Lungs are clear without infiltrate or effusion. IMPRESSION: No active disease. Electronically Signed   By: Marlan Palau M.D.   On: 10/13/2021 17:50    EKG: Independently reviewed. Sinus, no st elevation  Assessment/Plan Acute Right Medullary Infarct     - admit to inpt, tele @ [facility]     - tele-neuro has seen; neurology to follow at [facility]     - PT/OT/SLP/TOC     - lipids, A1c     - NPO for now; will start statin after cleared from NPO status     - echo     - all permissive HTN for now  HTN     - allow permissive HTN for now  DVT prophylaxis: SCDs  Code Status: FULL  Family Communication: None at bedside  Consults called: Neurology   Status is: Inpatient  Remains inpatient appropriate because: acute stroke  Time spent coordinating admission: 80 minutes  Morey Andonian A Hermilo Dutter DO [facility]  If 7PM-7AM, please contact night-coverage [email or web address]  10/14/2021, 8:30 AM

## 2021-10-14 NOTE — ED Notes (Signed)
Pt to MRI

## 2021-10-14 NOTE — ED Notes (Signed)
Pt transported to MRI 

## 2021-10-14 NOTE — Consult Note (Addendum)
[facility] Tele[facility]urology Consult Services   Patient Name:   Gary Richmond Date of Birth:   [birth year] Identification Number:   MRN - [id number] Date of Service:   10/14/2021 07:46:24  Diagnosis:       I63.9 - Cerebrovascular accident (CVA), unspecified mechanism ([place])  Impression:      The patient is a 56 year old man with a history of hypertension who presents with dysphagia and dysarthria secondary to right medullary CVA. Not a candidate for alteplase. CTA ordered and will follow up results. Recommend evaluation of vascular risk factors with TTE, lipid panel, HgbA1C. Tolerate permissive hypertension. ASA 81 mg and Plavix 75 mg QD.  Metrics: Last Known Well: 10/13/2021 11:00:00 [facility] Notification Time: 10/14/2021 07:46:24 Stamp Time: 10/14/2021 07:46:24 Initial Response Time: 10/14/2021 07:53:14 Symptoms: trouble swallowing. NIHSS Start Assessment Time: 10/14/2021 08:02:50 Patient is not a candidate for Thrombolytic. Thrombolytic Medical Decision: 10/14/2021 07:58:59 Patient was not deemed candidate for Thrombolytic because of following reasons: Last Well Known Above 4.5 Hours.  MRI brain - 1. 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 No associated hemorrhage or mass effect.   2. 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  ED Physician not notified of diagnostic impression and management plan because Discussed with nurse  Advanced Imaging: CTA Head and Neck Ordered:   Our recommendations are outlined below.  Recommendations:        Stroke/Telemetry Floor       Neuro Checks       Bedside Swallow Eval       DVT Prophylaxis       IV Fluids, Normal Saline       Head of Bed 30 Degrees       Euglycemia and Avoid Hyperthermia (PRN Acetaminophen)       Initiate or continue Aspirin 81 MG daily        Initiate Clopidogrel 75 mg daily       Antihypertensives PRN if Blood pressure is greater than 220/120 or there is a concern for End organ damage/contraindications for permissive HTN. If blood pressure is greater than 220/120 give labetalol PO or IV or Vasotec IV with a goal of 15% reduction in BP during the first 24 hours.  Routine Consultation with [facility] for Follow up Care    ------------------------------------------------------------------------------  History of Present Illness: Patient is a 56 year old Male.  Inpatient stroke alert was called for symptoms of trouble swallowing. Patient presents to ER yesterday for dysphagia and dysarthria starting at 11 AM. Suspicion was for food impaction. Endoscopy was negative. MRI brain today showed right medullary CVA. Neurology consulted for further evaluation. No significant clinical change from yesterday.   Past Medical History:      Hypertension      Diabetes Mellitus  Medications:  No Anticoagulant use  No Antiplatelet use Reviewed EMR for current medications  Allergies:  NKDA  Social History: Smoking: No  Family History:  There is no family history of premature cerebrovascular disease pertinent to this consultation  ROS : 14 Points Review of Systems was performed and was negative except mentioned in HPI.  Past Surgical History: There Is No Surgical History Contributory To Todays Visit    Examination: BP(158/71), Pulse(73), 1A: Level of Consciousness - Alert; keenly responsive + 0 1B: Ask Month and Age - Both Questions Right + 0 1C: Blink Eyes & Squeeze Hands - Performs Both Tasks +  0 2: Test Horizontal Extraocular Movements - Normal + 0 3: Test Visual Fields - No Visual Loss + 0 4: Test Facial Palsy (Use Grimace if Obtunded) - Normal symmetry + 0 5A: Test Left Arm Motor Drift - No Drift for 10 Seconds + 0 5B: Test Right Arm Motor Drift - No Drift for 10 Seconds + 0 6A: Test Left Leg Motor Drift - No  Drift for 5 Seconds + 0 6B: Test Right Leg Motor Drift - No Drift for 5 Seconds + 0 7: Test Limb Ataxia (FNF/Heel-Shin) - No Ataxia + 0 8: Test Sensation - Normal; No sensory loss + 0 9: Test Language/Aphasia - Normal; No aphasia + 0 10: Test Dysarthria - Mild-Moderate Dysarthria: Slurring but can be understood + 1 11: Test Extinction/Inattention - No abnormality + 0  NIHSS Score: 1   Pre-Morbid Modified Rankin Scale: 0 Points = No symptoms at all   Patient/Family was informed the Neurology Consult would occur via TeleHealth consult by way of interactive audio and video telecommunications and consented to receiving care in this manner.   Patient is being evaluated for possible acute neurologic impairment and high probability of imminent or life-threatening deterioration. I spent total of 30 minutes providing care to this patient, including time for face to face visit via telemedicine, review of medical records, imaging studies and discussion of findings with providers, the patient and/or family.   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 No acute interventions indicated for posterior circulation occlusions per guidelines.

## 2021-10-14 NOTE — ED Notes (Signed)
Patient has right sided facial droop when smiling.

## 2021-10-15 ENCOUNTER — Encounter (HOSPITAL_COMMUNITY): Payer: Self-pay | Admitting: Gastroenterology

## 2021-10-15 ENCOUNTER — Inpatient Hospital Stay (HOSPITAL_COMMUNITY): Payer: Self-pay

## 2021-10-15 DIAGNOSIS — M4802 Spinal stenosis, cervical region: Secondary | ICD-10-CM

## 2021-10-15 DIAGNOSIS — R1310 Dysphagia, unspecified: Secondary | ICD-10-CM

## 2021-10-15 DIAGNOSIS — G959 Disease of spinal cord, unspecified: Secondary | ICD-10-CM

## 2021-10-15 DIAGNOSIS — I10 Essential (primary) hypertension: Secondary | ICD-10-CM

## 2021-10-15 DIAGNOSIS — I700 Atherosclerosis of aorta: Secondary | ICD-10-CM

## 2021-10-15 DIAGNOSIS — E785 Hyperlipidemia, unspecified: Secondary | ICD-10-CM

## 2021-10-15 HISTORY — DX: Disease of spinal cord, unspecified: G95.9

## 2021-10-15 HISTORY — DX: Spinal stenosis, cervical region: M48.02

## 2021-10-15 LAB — LIPID PANEL
Cholesterol: 251 mg/dL — ABNORMAL HIGH (ref 0–200)
HDL: 55 mg/dL (ref >40)
LDL Cholesterol: 175 mg/dL — ABNORMAL HIGH (ref 0–99)
Total CHOL/HDL Ratio: 4.6 RATIO
Triglycerides: 103 mg/dL (ref <150)
VLDL: 21 mg/dL (ref 0–40)

## 2021-10-15 MED ORDER — ASPIRIN 81 MG PO CHEW
81.0000 mg | CHEWABLE_TABLET | Freq: Every day | ORAL | Status: DC
  Administered 2021-10-15 – 2021-10-16 (×2): 81 mg via ORAL
  Filled 2021-10-15 (×2): qty 1

## 2021-10-15 MED ORDER — ATORVASTATIN CALCIUM 40 MG PO TABS
80.0000 mg | ORAL_TABLET | Freq: Every day | ORAL | Status: DC
  Administered 2021-10-15 – 2021-10-16 (×2): 80 mg via ORAL
  Filled 2021-10-15 (×2): qty 2

## 2021-10-15 MED ORDER — ORAL CARE MOUTH RINSE
15.0000 mL | Freq: Two times a day (BID) | OROMUCOSAL | Status: DC
  Administered 2021-10-15 – 2021-10-16 (×2): 15 mL via OROMUCOSAL

## 2021-10-15 NOTE — ED Notes (Signed)
Pt to radiology at this time with Speech Therapy for swallow study.
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
## 2021-10-15 NOTE — ED Notes (Signed)
PT at the bedside.

## 2021-10-15 NOTE — Evaluation (Signed)
Clinical/Bedside Swallow Evaluation Patient Details  Name: Gary Richmond MRN: [id number] Date of Birth: 22-Sep-1966  Today's Date: 10/15/2021 Time: SLP Start Time (ACUTE ONLY): 0830 SLP Stop Time (ACUTE ONLY): 0900 SLP Time Calculation (min) (ACUTE ONLY): 30 min  Past Medical History:  Past Medical History:  Diagnosis Date   Hypertension    Past Surgical History: History reviewed. No pertinent surgical history. HPI:  Gary Richmond is a 56 y.o. male with medical history significant of HTN. Presenting with dysphagia. He was in his normal stat of health until 2 days ago. That evening he noted that he had some difficulty swallowing liquids. He didn't seem to have difficulty with anything prior to then.    Assessment / Plan / Recommendation  Clinical Impression  Pt reports difficulty swallowing for the past three days. He started choking on water. Pt has a mild right CN VII weakness on exam. Vocal quality is mildly dysphonic, but pt denies difference. When given teaspoons of nectar thick juice pt initaties multiple partial swallows with obviously reduced hyoid elevation. SLP immediately arranged MBS prior to tx to [facility]. SLP Visit Diagnosis: Dysphagia, oropharyngeal phase (R13.12)    Aspiration Risk       Diet Recommendation          Other  Recommendations      Recommendations for follow up therapy are one component of a multi-disciplinary discharge planning process, led by the attending physician.  Recommendations may be updated based on patient status, additional functional criteria and insurance authorization.  Follow up Recommendations        Assistance Recommended at Discharge    Functional Status Assessment    Frequency and Duration            Prognosis        Swallow Study   General HPI: Gary Richmond is a 56 y.o. male with medical history significant of HTN. Presenting with dysphagia. He was in his normal stat of health until 2 days ago.  That evening he noted that he had some difficulty swallowing liquids. He didn't seem to have difficulty with anything prior to then. Type of Study: Bedside Swallow Evaluation Previous Swallow Assessment: none Diet Prior to this Study: NPO Temperature Spikes Noted: No Respiratory Status: Room air History of Recent Intubation: No Behavior/Cognition: Alert;Cooperative;Pleasant mood Oral Cavity Assessment: Within Functional Limits Oral Care Completed by SLP: No Oral Cavity - Dentition: Adequate natural dentition Vision: Functional for self-feeding Self-Feeding Abilities: Able to feed self Patient Positioning: Upright in bed Baseline Vocal Quality: Normal Volitional Cough: Strong Volitional Swallow: Unable to elicit    Oral/Motor/Sensory Function Overall Oral Motor/Sensory Function: Mild impairment Facial ROM: Reduced right;Suspected CN VII (facial) dysfunction Facial Symmetry: Abnormal symmetry right;Suspected CN VII (facial) dysfunction Facial Strength: Reduced right;Suspected CN VII (facial) dysfunction Facial Sensation: Within Functional Limits Lingual ROM: Within Functional Limits Lingual Symmetry: Within Functional Limits Lingual Strength: Within Functional Limits   Ice Chips Ice chips: Not tested   Thin Liquid Thin Liquid: Not tested    Nectar Thick Nectar Thick Liquid: Impaired Presentation: Spoon Pharyngeal Phase Impairments: Decreased hyoid-laryngeal movement;Multiple swallows;Throat Clearing - Immediate   Honey Thick Honey Thick Liquid: Not tested   Puree Puree: Not tested   Solid     Solid: Not tested      Gary Richmond 10/15/2021,12:52 PM

## 2021-10-15 NOTE — Evaluation (Signed)
Speech Language Pathology Evaluation Patient Details Name: Gary Richmond MRN: [id number] DOB: 04/24/1966 Today's Date: 10/15/2021 Time: 0830-0900 SLP Time Calculation (min) (ACUTE ONLY): 30 min  Problem List:  Patient Active Problem List   Diagnosis Date Noted   Stroke-like symptoms 10/14/2021   CVA (cerebral vascular accident) (HCC) 10/14/2021   Past Medical History:  Past Medical History:  Diagnosis Date   Hypertension    Past Surgical History: History reviewed. No pertinent surgical history. HPI:  Gary Richmond is a 56 y.o. male with medical history significant of HTN. Presenting with dysphagia. He was in his normal stat of health until 2 days ago. That evening he noted that he had some difficulty swallowing liquids. He didn't seem to have difficulty with anything prior to then.   Assessment / Plan / Recommendation Clinical Impression  Pt demonstrates no cognitive impairment. Pt with a slight right labial weakness, perhaps a slight dysarthria, but pt denies it. 100% intelligible. No SLP f/u for speech, language or cogntition needed.    SLP Assessment  SLP Recommendation/Assessment: Patient does not need any further Speech Lanaguage Pathology Services SLP Visit Diagnosis: Dysphagia, oropharyngeal phase (R13.12)    Recommendations for follow up therapy are one component of a multi-disciplinary discharge planning process, led by the attending physician.  Recommendations may be updated based on patient status, additional functional criteria and insurance authorization.    Follow Up Recommendations       Assistance Recommended at Discharge     Functional Status Assessment    Frequency and Duration           SLP Evaluation Cognition  Overall Cognitive Status: Within Functional Limits for tasks assessed       Comprehension  Auditory Comprehension Overall Auditory Comprehension: Appears within functional limits for tasks assessed    Expression Verbal  Expression Overall Verbal Expression: Appears within functional limits for tasks assessed Written Expression Dominant Hand: Right   Oral / Motor  Oral Motor/Sensory Function Overall Oral Motor/Sensory Function: Mild impairment Facial ROM: Reduced right;Suspected CN VII (facial) dysfunction Facial Symmetry: Abnormal symmetry right;Suspected CN VII (facial) dysfunction Facial Strength: Reduced right;Suspected CN VII (facial) dysfunction Facial Sensation: Within Functional Limits Lingual ROM: Within Functional Limits Lingual Symmetry: Within Functional Limits Lingual Strength: Within Functional Limits Motor Speech Overall Motor Speech: Appears within functional limits for tasks assessed            Gary Richmond, Gary Richmond 10/15/2021, 1:04 PM

## 2021-10-15 NOTE — ED Notes (Signed)
Pt provided with meal tray.

## 2021-10-15 NOTE — ED Notes (Signed)
Pt ambulated to and from bathroom with assistance of walker without difficulty.

## 2021-10-15 NOTE — Evaluation (Signed)
Modified Barium Swallow Progress Note  Patient Details  Name: Gary Richmond MRN: [id number] Date of Birth: 1966-09-26  Today's Date: 10/15/2021  Modified Barium Swallow completed.  Full report located under Chart Review in the Imaging Section.  Brief recommendations include the following:  Clinical Impression  Pt demonstrates a moderate dysphagia, improving significantly with opportunities to swallow. Impairment is decreased hyoid excursion and decreased PES opening resulting in mild to moderate pharyngeal residuals. Cues for effortful swallow, intermittent throat clearing to eject very trace penetrate and following solid with liquids all helpful. Pt also felt benefit of head turn right, which he can use if he likes though it is not necessary. Pt can start regular solids and nectar thick liquids. He could really tolerate thin liquids as well at this point, but nectar may be a bit easier to manage at first. Expect pt will be able to progress well with acute SLP f/u. OP SLP is suggested but pt will also likely manage and improve independently.   Swallow Evaluation Recommendations       SLP Diet Recommendations: Regular solids;Nectar thick liquid   Liquid Administration via: Cup;Straw   Medication Administration: Whole meds with puree   Supervision: Patient able to self feed   Compensations: Slow rate;Small sips/bites   Postural Changes: Remain semi-upright after after feeds/meals (Comment)         Gary Ditty, MA CCC-SLP  Acute Rehabilitation Services Office [phone]    Gary Richmond, Gary Richmond 10/15/2021,1:30 PM

## 2021-10-15 NOTE — Assessment & Plan Note (Signed)
statin

## 2021-10-15 NOTE — Assessment & Plan Note (Signed)
Permissive hypertension 

## 2021-10-15 NOTE — Anesthesia Postprocedure Evaluation (Signed)
Anesthesia Post Note  Patient: Gary Richmond  Procedure(s) Performed: ESOPHAGOGASTRODUODENOSCOPY (EGD) WITH PROPOFOL     Patient location during evaluation: Endoscopy Anesthesia Type: General Level of consciousness: awake and alert Pain management: pain level controlled Vital Signs Assessment: post-procedure vital signs reviewed and stable Respiratory status: spontaneous breathing, nonlabored ventilation, respiratory function stable and patient connected to nasal cannula oxygen Cardiovascular status: blood pressure returned to baseline and stable Postop Assessment: no apparent nausea or vomiting Anesthetic complications: no   No notable events documented.  Last Vitals:  Vitals:   10/15/21 1005 10/15/21 1100  BP: (!) 151/93 (!) 156/86  Pulse: 62 60  Resp: 16   Temp:    SpO2: 92% 91%    Last Pain:  Vitals:   10/15/21 0851  TempSrc: Oral  PainSc:    Pain Goal: Patients Stated Pain Goal: 0 (10/13/21 1930)                 Trevor Iha

## 2021-10-15 NOTE — ED Notes (Signed)
Pt back from swallow study at this time. OT now at the bedside.

## 2021-10-15 NOTE — Assessment & Plan Note (Signed)
--   See plan as above for cervical myelopathy.

## 2021-10-15 NOTE — Assessment & Plan Note (Signed)
--   Cervical MRI with moderate to severe stenosis with worrisome findings for myelopathy C4-C5.  I discussed with Dr. Lovell Sheehan, given that the patient is asymptomatic at this point, no acute interventions required.  He recommended outpatient follow-up in his office.

## 2021-10-15 NOTE — Hospital Course (Addendum)
56 year old man PMH essential hypertension seen in the emergency department 1/4 with reported difficulty swallowing, concern for esophageal impaction development patient was transferred to the [facility], ER where he underwent urgent EGD which showed no esophageal abnormalities but did show large amount of food in the stomach, he was recommended for discharge.  1/5 he remained in the emergency department was noted to have right-sided facial weakness and code stroke was called.  MRI revealed acute medullary infarct.

## 2021-10-15 NOTE — Progress Notes (Signed)
°  Progress Note   Patient: Gary Richmond [id number] DOB: 21-Dec-1965 DOA: 10/13/2021     1 DOS: the patient was seen and examined on 10/15/2021   Brief hospital course: 56 year old man PMH essential hypertension seen in the emergency department 1/4 with reported difficulty swallowing, concern for esophageal impaction development patient was transferred to the [facility], ER where he underwent urgent EGD which showed no esophageal abnormalities but did show large amount of food in the stomach, he was recommended for discharge.  1/5 he remained in the emergency department was noted to have right-sided facial weakness and code stroke was called.  MRI revealed acute medullary infarct.  Assessment and Plan * CVA (cerebral vascular accident) ([place])- (present on admission) -- With right-sided weakness and symptoms of dysphagia.  Dysphagia resolved but still has right-sided weakness. -- Dr. Quinn Axe neurology recommended transfer to [facility] be canceled and that patient stay at [facility].  Further recommendations per neurology.  Hyperlipidemia --statin  Benign essential HTN -- Permissive hypertension  Cervical myelopathy (HCC) -- Cervical MRI with moderate to severe stenosis with worrisome findings for myelopathy C4-C5.  I discussed with Dr. Arnoldo Morale, given that the patient is asymptomatic at this point, no acute interventions required.  He recommended outpatient follow-up in his office.  Cervical stenosis of spinal canal -- See plan as above for cervical myelopathy.  Aortic atherosclerosis (HCC) --statin     Subjective:  Reports his swallowing is much better today.  Breathing fine.  No nausea or vomiting.  No paresthesias, no motor weakness.  Patient seen, examined and plan discussed with aid of interpreter Alejandra  Objective Vital signs were reviewed and unremarkable. Physical Exam Vitals reviewed.  Constitutional:      General: He is not in acute distress.     Appearance: He is not ill-appearing or toxic-appearing.  Cardiovascular:     Rate and Rhythm: Normal rate and regular rhythm.     Heart sounds: No murmur heard. Pulmonary:     Effort: Pulmonary effort is normal. No respiratory distress.     Breath sounds: No wheezing or rales.  Musculoskeletal:     Right lower leg: No edema.     Left lower leg: No edema.  Neurological:     Mental Status: He is alert.     Cranial Nerves: Cranial nerve deficit (right facial weakness) present.     Comments: No weakness of arms or legs, sensation grossly intact all 4 extremities.  Psychiatric:        Mood and Affect: Mood normal.        Behavior: Behavior normal.   Data Reviewed:  CMP unremarkable, LDL 175, CBC unremarkable.  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  C-spine MRI showed moderate to severe spinal canal stenosis C3-C4, severe spinal canal stenosis C4-C5, as well as stenosis at C6-C7 with cord compression worst at C4-C5.  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  This was discussed with Dr. Newman Pies neurosurgery who recommended outpatient follow-up, no acute intervention.  Family Communication: none  Disposition: Status is: Inpatient  Remains inpatient appropriate because: stroke         Time spent: 45 minutes  Author: Murray Hodgkins, MD 10/15/2021 8:22 PM  For on call review [email or web address].

## 2021-10-15 NOTE — Assessment & Plan Note (Signed)
--   With right-sided weakness and symptoms of dysphagia.  Dysphagia resolved but still has right-sided weakness. -- Dr. Quinn Axe neurology recommended transfer to [facility] be canceled and that patient stay at [facility].  Further recommendations per neurology.

## 2021-10-16 DIAGNOSIS — I10 Essential (primary) hypertension: Secondary | ICD-10-CM

## 2021-10-16 LAB — HEMOGLOBIN A1C
Hgb A1c MFr Bld: 5.9 % — ABNORMAL HIGH (ref 4.8–5.6)
Mean Plasma Glucose: 122.63 mg/dL

## 2021-10-16 MED ORDER — ATORVASTATIN CALCIUM 80 MG PO TABS: 80.0000 mg | ORAL_TABLET | Freq: Every day | ORAL | 2 refills | Status: AC

## 2021-10-16 MED ORDER — FOOD THICKENER (SIMPLYTHICK): 1.0000 | ORAL | Status: DC | PRN

## 2021-10-16 MED ORDER — FOOD THICKENER (SIMPLYTHICK): 1.0000 | ORAL | 3 refills | Status: AC | PRN

## 2021-10-16 MED ORDER — CLOPIDOGREL BISULFATE 75 MG PO TABS: 75.0000 mg | ORAL_TABLET | Freq: Every day | ORAL | 2 refills | Status: AC

## 2021-10-16 MED ORDER — ASPIRIN 81 MG PO CHEW: 81.0000 mg | CHEWABLE_TABLET | Freq: Every day | ORAL | Status: AC

## 2021-10-16 MED ORDER — CLOPIDOGREL BISULFATE 75 MG PO TABS
75.0000 mg | ORAL_TABLET | Freq: Every day | ORAL | Status: DC
  Administered 2021-10-16: 75 mg via ORAL
  Filled 2021-10-16: qty 1

## 2021-10-16 NOTE — Plan of Care (Signed)
Pt will leave this afternoon with an Biomedical scientist. Pt alert and oriented. Discharge instructions/prescription given/explained with pt verbalizing understanding. Pt aware to followup with PCP. Also, aware to followup with Neurologist r/t stroke of this admission.
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
## 2021-10-16 NOTE — TOC Transition Note (Signed)
Transition of Care [facility]) - CM/SW Discharge Note   Patient Details  Name: Gary Richmond MRN: [id number] Date of Birth: [birth year]  [facility] CM/SW Contact:  Illene Regulus, LCSW Phone Number: 10/16/2021, 2:10 PM   Clinical Narrative:     TOC CSW arranged [facility] ride for pt to d/c home. Ride is arranged for 3pm.        Patient Goals and CMS Choice        Discharge Placement                       Discharge Plan and Services                                     Social Determinants of Health (SDOH) Interventions     Readmission Risk Interventions No flowsheet data found.

## 2021-10-16 NOTE — Progress Notes (Signed)
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 Not a candidate for alteplase. He was seen by teleneurology for stroke code on arrival. His stroke workup has now been completed; results as follows:  MRI brain wo contrast  1. 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 No associated hemorrhage or mass effect.   2. 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  CTA neck:   1. 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 2. The dominant left vertebral artery is patent within the neck without stenosis. 3. The common carotid and internal carotid arteries are patent within the neck without significant stenosis. Minimal atherosclerotic plaque within the bilateral carotid systems within the neck, as described. 4.  Aortic Atherosclerosis (ICD10-I70.0).   CTA head:   1. 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 Additionally, the proximal right PICA is poorly delineated and may also be occluded. 2. Intracranial atherosclerotic disease with multifocal stenoses elsewhere, most notably as follows. 3. 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 4. Sites of moderate stenosis within the P2 and P3 left PCA. 5. Severe stenosis within the mid/distal M1 segment of the right middle cerebral artery. 6. Severe stenosis within a proximal M2 right MCA vessel. 7. Severe stenosis within the proximal M1 segment of the left middle cerebral artery.   CT perfusion head:   The perfusion software identifies a 3 mL region of critically hypoperfused parenchyma within the anterolateral  left frontal lobe (left MCA vascular territory) (utilizing the Tmax>6 seconds threshold). The perfusion software identifies no core infarct. Reported mismatch volume: 3 mL.  TTE: no intracardiac clot  Stroke Labs     Component Value Date/Time   CHOL 251 (H) 10/15/2021 0225   TRIG 103 10/15/2021 0225   HDL 55 10/15/2021 0225   CHOLHDL 4.6 10/15/2021 0225   VLDL 21 10/15/2021 0225   LDLCALC 175 (H) 10/15/2021 0225   Final recommendations: - No further inpatient neurologic workup indicated, stroke workup is complete - Goal normotension - A1c pending - Atorvastatin 80mg  for LDL 175 - ASA 81mg  daily + plavix 75mg  daily x90 days f/b ASA 81mg  daily monotherapy after that (90 days 2/2 intracranial vertebral high-grade stenosis and occlusion) - PT/OT/SLP - Stroke education - I will arrange outpatient neurology f/u  # Cervical stenosis - MRI c spine with moderate to severe stenosis with 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 Given patient is asymptomatic, Dr. recommended outpatient f/u  , MD [facility] [phone]  If 7pm- 7am, please page neurology on call as listed in AMION.

## 2021-10-16 NOTE — Discharge Summary (Addendum)
Physician Discharge Summary   Patient: Gary Richmond MRN: [id number] DOB: 16-Apr-1966  Admit date:     10/13/2021  Discharge date: 10/16/21  Discharge Physician: Brendia Sacks   PCP: Pcp, No  Lives in [place] Recommended establishing w/ PCP  Recommendations at discharge:    Stroke with mild dysphagia and right-sided facial weakness. Outpatient SLP recommended, pt wished to try to establish w/ PCP in home/[place] and pursue that way. Also recommend outpt PT. Given work note, recommended not to work until cleared by outpatient physician Hyperlipidemia  Severe cervical stenosis, needs outpatient follow-up with neurosurgery.  Discharge Diagnoses Principal Problem:   CVA (cerebral vascular accident) (HCC) Active Problems:   Benign essential HTN   Hyperlipidemia   Cervical stenosis of spinal canal   Cervical myelopathy (HCC)   Aortic atherosclerosis [facility])    Hospital Course   56 year old man PMH essential hypertension seen in the emergency department 1/4 with reported difficulty swallowing, concern for esophageal impaction development patient was transferred to the [facility], ER where he underwent urgent EGD which showed no esophageal abnormalities but did show large amount of food in the stomach, he was recommended for discharge.  1/5 he remained in the emergency department was noted to have right-sided facial weakness and code stroke was called.  MRI revealed acute medullary infarct.  He was admitted for further evaluation.  Stroke work-up completed.  Started on statin, aspirin and Plavix per neurology recommendations.  Seen by therapy with recommendation for outpatient speech therapy for mild dysphagia.  Discussed in detail with patient on day of discharge diagnosis and recommended medications and discharge instructions with aid of interpreter provided by hospital via iPad.  * CVA (cerebral vascular accident) (HCC)- (present on admission) -- With right-sided facial weakness  and symptoms of dysphagia.  Dysphagia resolved but still has right-sided weakness. --Regular diet with nectar thick liquids per speech therapy.  Outpatient speech therapy. --Per neurology No further inpatient neurologic workup indicated, stroke workup is complete - Goal normotension - Atorvastatin 80mg  for LDL 175 - ASA 81mg  daily + plavix 75mg  daily x90 days f/b ASA 81mg  daily monotherapy after that (90 days 2/2 intracranial vertebral high-grade stenosis and occlusion) - PT/OT/SLP - Stroke education -Patient consented to outpatient follow-up.  [place] over the [place]  Hyperlipidemia --statin  Benign essential HTN -- Permissive hypertension  Cervical myelopathy (HCC) -- Cervical MRI with moderate to severe stenosis with worrisome findings for myelopathy C4-C5.  I discussed with Dr. Lovell Sheehan, given that the patient is asymptomatic at this point, no acute interventions required.  He recommended outpatient follow-up in his office.  Cervical stenosis of spinal canal -- See plan as above for cervical myelopathy.  Aortic atherosclerosis (HCC) --statin      Consultants: Neurology  Procedures performed: none  Disposition: Home Diet recommendation: Cardiac diet with nectar thick liquids  DISCHARGE MEDICATION: Allergies as of 10/16/2021   No Known Allergies      Medication List     TAKE these medications    aspirin 81 MG chewable tablet Chew 1 tablet (81 mg total) by mouth daily. Start taking on: October 17, 2021   atorvastatin 80 MG tablet Commonly known as: LIPITOR Take 1 tablet (80 mg total) by mouth daily. Start taking on: October 17, 2021   clopidogrel 75 MG tablet Commonly known as: PLAVIX Take 1 tablet (75 mg total) by mouth daily. Start taking on: October 17, 2021   food thickener Gel Commonly known as: SIMPLYTHICK (NECTAR/LEVEL 2/MILDLY THICK) Take 1  packet by mouth as needed. Add packet to 4 ounces of liquid for Nectar/Level 2/Mildly Thick         Follow-up Information     Your primary care physician. Schedule an appointment as soon as possible for a visit .          Serena Colonel, MD. Schedule an appointment as soon as possible for a visit .   Specialty: Otolaryngology Contact information: [address] [place] [address] [phone]                Carollee Sires, seen, examined and plan discussed via interpreter via iPad Annabell Howells okay today, dysphagia improving.  Did have some difficulty with whole pills but did well and they were crushed.  Discharge Exam: Filed Weights   10/13/21 1135 10/15/21 1743  Weight: 72.6 kg 73.5 kg   Physical Exam Vitals reviewed.  Constitutional:      General: He is not in acute distress.    Appearance: He is not ill-appearing or toxic-appearing.  Cardiovascular:     Rate and Rhythm: Normal rate and regular rhythm.     Heart sounds: No murmur heard. Pulmonary:     Effort: Pulmonary effort is normal. No respiratory distress.     Breath sounds: No wheezing, rhonchi or rales.  Neurological:     Cranial Nerves: Cranial nerve deficit (right facial weakness) present.  Psychiatric:        Mood and Affect: Mood normal.        Behavior: Behavior normal.     Condition at discharge: good  The results of significant diagnostics from this hospitalization (including imaging, microbiology, ancillary and laboratory) are listed below for reference.   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 Notably, there is apparent moderate or severe spinal canal stenosis at C3-C4, severe spinal canal stenosis at C4-C5 and apparent moderate or severe spinal canal stenosis at C5-C6. Multilevel bony neural foraminal narrowing. These results will be called to the ordering clinician or  representative by the Radiologist Assistant, and communication documented in the PACS or [employer]. Electronically Signed   By: Jackey Loge D.O.   On: 10/14/2021 10:12   Result Date: 10/14/2021 CLINICAL DATA:  Elevated blood pressure, difficulty swelling, right facial droop. EXAM: CT ANGIOGRAPHY HEAD AND NECK CT PERFUSION BRAIN TECHNIQUE: Multidetector CT imaging of the head and neck was performed using the standard protocol during bolus administration of intravenous contrast. Multiplanar CT image reconstructions and MIPs were obtained to evaluate the vascular anatomy. Carotid stenosis measurements (when applicable) are obtained utilizing NASCET criteria, using the distal internal carotid diameter as the denominator. Multiphase CT imaging of the brain was performed following IV bolus contrast injection. Subsequent parametric perfusion maps were calculated using RAPID software. CONTRAST:  OMNIPAQUE IOHEXOL 350 MG/ML SOLN COMPARISON:  Noncontrast head CT performed earlier today 10/14/2021. Brain MRI performed earlier today 10/14/2021. FINDINGS: CTA NECK FINDINGS Aortic arch: Common origin of the innominate and left common carotid arteries. Minimal atherosclerotic plaque within the visualized aortic arch and proximal major branch vessels of the neck. No hemodynamically significant innominate or proximal subclavian artery stenosis. Right carotid system: CCA and ICA patent within the neck without significant stenosis (50% or greater). Minimal atherosclerotic plaque about the carotid. Left carotid system: CCA and ICA patent within the neck without significant stenosis (50% or  greater). Minimal atherosclerotic plaque about the carotid bifurcation and within the proximal internal and external carotid arteries. Vertebral arteries: The non dominant right vertebral artery becomes occluded at the V3 level and remains occluded intracranially. Mild nonstenotic calcified plaque at the origin of the right vertebral  artery. The dominant left vertebral artery is patent within the neck without stenosis. Skeleton: Cervical levocurvature. Cervical spondylosis, most notably as follows. At C3-C4, a partially calcified central disc protrusion and ossification of the posterior longitudinal ligament contribute to moderate or severe spinal canal stenosis. At C4-C5, posterior disc osteophyte complex and ossification of the posterior longitudinal ligament contribute to severe spinal canal stenosis. At C5-C6, a posterior disc osteophyte complex contributes to moderate or severe spinal canal stenosis. Multilevel bony neural foraminal narrowing. Other neck: No neck mass or cervical lymphadenopathy. Upper chest: No consolidation within the imaged lung apices. Review of the MIP images confirms the above findings CTA HEAD FINDINGS Anterior circulation: The intracranial internal carotid arteries are patent. Atherosclerotic plaque within both vessels. No significant stenosis within the intracranial right ICA. Mild stenosis of the paraclinoid left ICA. Prominent atherosclerotic irregularity of the M1 right MCA. There are sites of severe stenosis within the mid to distal M1 right MCA. Atherosclerotic irregularity of the M2 and more distal right MCA vessels. Most notably, there is a site of severe stenosis within a proximal right M2 vessel (series 16, image 10). Severe stenosis within the proximal M1 left middle cerebral artery. Atherosclerotic irregularity of the M2 and more distal left middle cerebral arteries. No M2 proximal branch occlusion or high-grade proximal stenosis is identified. The anterior cerebral arteries are patent. Atherosclerotic irregularity of both vessels without high-grade proximal stenosis. No intracranial aneurysm is identified. Posterior circulation: The intracranial right vertebral artery is occluded. The proximal right PICA is poorly delineated and may also be occluded. The intracranial left vertebral artery is patent  without stenosis. 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 The posterior cerebral arteries are patent. Fetal origin right posterior cerebral artery. Atherosclerotic irregularity of both PCAs. Most notably, there are sites of moderate stenosis within the P2 and P3 left PCA. The left posterior communicating artery is diminutive or absent. Venous sinuses: Within the limitations of contrast timing, no convincing thrombus. Anatomic variants: As described. Review of the MIP images confirms the above findings CT Brain Perfusion Findings: CBF (<30%) Volume: 0 mL Perfusion (Tmax>6.0s) volume: 3 mL (anterior left frontal lobe). Mismatch Volume: 3mL Infarction Location:None identified. Critical/salient results called by telephone at the time of interpretation on 10/14/2021 at 8:40 am to provider Dr. Imogene Burn, who verbally acknowledged these results. IMPRESSION: CTA neck: 1. 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 2. The dominant left vertebral artery is patent within the neck without stenosis. 3. The common carotid and internal carotid arteries are patent within the neck without significant stenosis. Minimal atherosclerotic plaque within the bilateral carotid systems within the neck, as described. 4.  Aortic Atherosclerosis (ICD10-I70.0). CTA head: 1. 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 Additionally, the proximal right PICA is poorly delineated and may also be occluded. 2. Intracranial atherosclerotic disease with multifocal stenoses elsewhere, most notably as follows. 3. 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 4. Sites of moderate stenosis within the P2 and P3 left PCA. 5. Severe stenosis within the mid/distal M1 segment of the right middle cerebral artery. 6. Severe stenosis within a proximal M2 right  MCA vessel. 7. Severe stenosis  within the proximal M1 segment of the left middle cerebral artery. CT perfusion head: The perfusion software identifies a 3 mL region of critically hypoperfused parenchyma within the anterolateral left frontal lobe (left MCA vascular territory) (utilizing the Tmax>6 seconds threshold). The perfusion software identifies no core infarct. Reported mismatch volume: 3 mL. Electronically Signed: By: Jackey Loge D.O. On: 10/14/2021 08:44   CT Head Wo Contrast  Result Date: 10/14/2021 CLINICAL DATA:  Mental status change. EXAM: CT HEAD WITHOUT CONTRAST TECHNIQUE: Contiguous axial images were obtained from the base of the skull through the vertex without intravenous contrast. COMPARISON:  None. FINDINGS: Brain: No acute intracranial hemorrhage, midline shift or mass effect. No extra-axial fluid collection. Subcortical and periventricular white matter hypodensities are noted bilaterally. There is no hydrocephalus. Vascular: Atherosclerotic calcification of the carotid siphons. No hyperdense vessel. Skull: Normal. Negative for fracture or focal lesion. Sinuses/Orbits: No acute finding. Other: None. IMPRESSION: 1. No acute intracranial process. 2. Subcortical and periventricular white matter hypodensities bilaterally suggesting chronic microvascular ischemic changes. If there is clinical concern for acute infarct, MRI is suggested for follow-up. Electronically Signed   By: Thornell Sartorius M.D.   On: 10/14/2021 01:39   CT Soft Tissue Neck W Contrast  Result Date: 10/13/2021 CLINICAL DATA:  Palate weakness, difficulty swallowing, evaluate for retropharyngeal abscess and lemierre's EXAM: CT NECK WITH CONTRAST TECHNIQUE: Multidetector CT imaging of the neck was performed using the standard protocol following the bolus administration of intravenous contrast. CONTRAST:  75mL OMNIPAQUE IOHEXOL 300 MG/ML  SOLN COMPARISON:  None. FINDINGS: Pharynx and larynx: Normal. No mass or swelling. Salivary glands: No inflammation, mass, or  stone. Thyroid: Normal. Lymph nodes: None enlarged or abnormal density. Vascular: Grossly patent arteries and veins. Limited intracranial: Negative. Visualized orbits: Negative. Mastoids and visualized paranasal sinuses: Clear. 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 No acute osseous abnormality. Upper chest: Focal pulmonary opacity. Other: None IMPRESSION: No acute process in the neck. No etiology is seen for the patient's difficulty swallowing. Electronically Signed   By: Wiliam Ke M.D.   On: 10/13/2021 15:05   MR BRAIN WO CONTRAST  Addendum Date: 10/14/2021   ADDENDUM REPORT: 10/14/2021 07:23 ADDENDUM: Study discussed by telephone with Dr. Tilden Fossa on 10/14/2021 at 0712 hours. Electronically Signed   By: Odessa Fleming M.D.   On: 10/14/2021 07:23   Result Date: 10/14/2021 CLINICAL DATA:  56 year old male with right side facial droop and dysphagia. EXAM: MRI HEAD WITHOUT CONTRAST TECHNIQUE: Multiplanar, multiecho pulse sequences of the brain and surrounding structures were obtained without intravenous contrast. COMPARISON:  Head CT 0129 hours today. FINDINGS: Brain: Linear roughly 8 mm area of restricted diffusion in the right posterior and lateral medulla (series 5, images 54 and 55). Faint associated T2 and FLAIR hyperintensity (series 11, image 9). No hemorrhage or mass effect. No convincing cerebellar or other posterior circulation diffusion restriction. No superimposed supratentorial restricted diffusion. No midline shift, mass effect, evidence of mass lesion, ventriculomegaly, extra-axial collection or acute intracranial hemorrhage. Cervicomedullary junction and pituitary are within normal limits. -supratentorial scattered cerebral white matter T2 and FLAIR hyperintensity is moderate for age and includes chronic lacunar infarct of the anterior left corona radiata seen by CT.  There is also evidence of a chronic microhemorrhage at the right mesial temporal lobe on SWI. And there is a chronic lacunar infarct of the medial right thalamus  also. Vascular: Loss of the distal right vertebral artery flow void at the skull base (series 8, image 1 and series 15, image 11, although other Major intracranial vascular flow voids are preserved. And there is conspicuous susceptibility artifact throughout the distal right vertebral artery on SWI (series 9, image 5). Skull and upper cervical spine: Negative. Visualized bone marrow signal is within normal limits. Sinuses/Orbits: Rightward gaze.  Otherwise negative. Other: Negative orbit and scalp soft tissues. IMPRESSION: 1. 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 No associated hemorrhage or mass effect. 2. 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 Electronically Signed: By: Odessa Fleming M.D. On: 10/14/2021 07:09   MR CERVICAL SPINE WO CONTRAST  Result Date: 10/14/2021 CLINICAL DATA:  Spinal stenosis EXAM: MRI CERVICAL SPINE WITHOUT CONTRAST TECHNIQUE: Multiplanar, multisequence MR imaging of the cervical spine was performed. No intravenous contrast was administered. COMPARISON:  Same-day CTA neck FINDINGS: Alignment: Normal. Vertebrae: Vertebral body heights are preserved. There is degenerative endplate marrow signal abnormality at C3-C4 and C4-C5. There is no suspicious marrow signal abnormality. Cord: The cord is compressed at C3-C4 and C4-C5. There is possible cord signal abnormality within the right aspect of the cord at C4 (8-12) and centrally at C4-C5 (6-8, 8-16). Posterior Fossa, vertebral arteries, paraspinal tissues: There is T2 hyperintensity in the right aspect of the medulla consistent with evolving infarct as seen on the prior brain MRI. The posterior fossa is assessed on the separately dictated  brain MRI and head CT. The right vertebral artery flow void is attenuated in the upper neck, better evaluated on the prior CTA. The left vertebral artery flow void is present. The paraspinal soft tissues are unremarkable. Disc levels: There is multilevel disc desiccation and narrowing, most advanced at C4-C5. C2-C3: No significant spinal canal or neural foraminal stenosis. C3-C4: There is a broad-based posterior disc osteophyte complex and uncovertebral and bilateral facet arthropathy resulting in moderate to severe spinal canal stenosis with cord compression and possible cord signal abnormality and moderate to severe right and mild left neural foraminal stenosis. C4-C5: There is ossification of the posterior longitudinal ligament along the posterior C4 endplate as seen on prior CT, uncovertebral and facet arthropathy, and a prominent central protrusion resulting in severe spinal canal stenosis with cord compression and severe right and moderate left neural foraminal stenosis. C5-C6: There is a posterior disc osteophyte complex with a small central annular fissure and uncovertebral and facet arthropathy resulting in mild spinal canal stenosis and severe right worse than left neural foraminal stenosis. C6-C7: There is a posterior disc osteophyte complex, prominent ligamentum flavum thickening, and uncovertebral and facet arthropathy resulting in moderate spinal canal stenosis with effacement of the ventral thecal sac and severe bilateral neural foraminal stenosis. C7-T1: No significant spinal canal or neural foraminal stenosis. IMPRESSION: 1. Multilevel degenerative changes throughout the cervical spine as detailed above resulting in moderate to severe spinal canal stenosis at C3-C4, severe spinal canal stenosis at C4-C5, and moderate spinal canal stenosis at C6-C7 with cord compression worst at C4-C5. 2. Suspected cord signal abnormality in the right aspect of the cord at C4 and centrally at C4-C5 suspicious for  compressive myelopathy. 3. Multilevel neural foraminal stenosis, moderate to severe on the right at C3-C4, severe on the right at C4-C5, and severe bilaterally at C5-C6 and C6-C7. Electronically Signed   By: Lesia Hausen M.D.   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 Notably, there is apparent moderate or severe spinal canal stenosis at C3-C4, severe spinal canal stenosis at C4-C5 and apparent moderate or severe spinal canal stenosis at C5-C6. Multilevel bony neural foraminal narrowing. These results will be called to the ordering clinician or representative by the Radiologist Assistant, and communication documented in the PACS or [employer]. Electronically Signed   By: Jackey Loge D.O.   On: 10/14/2021 10:12   Result Date: 10/14/2021 CLINICAL DATA:  Elevated blood pressure, difficulty swelling, right facial droop. EXAM: CT ANGIOGRAPHY HEAD AND NECK CT PERFUSION BRAIN TECHNIQUE: Multidetector CT imaging of the head and neck was performed using the standard protocol during bolus administration of intravenous contrast. Multiplanar CT image reconstructions and MIPs were obtained to evaluate the vascular anatomy. Carotid stenosis measurements (when applicable) are obtained utilizing NASCET criteria, using the distal internal carotid diameter as the denominator. Multiphase CT imaging of the brain was performed following IV bolus contrast injection. Subsequent parametric perfusion maps were calculated using RAPID software. CONTRAST:  OMNIPAQUE IOHEXOL 350 MG/ML SOLN COMPARISON:  Noncontrast head CT performed earlier today 10/14/2021. Brain MRI performed earlier today 10/14/2021. FINDINGS: CTA NECK FINDINGS Aortic arch: Common origin of the innominate and left common carotid arteries. Minimal atherosclerotic  plaque within the visualized aortic arch and proximal major branch vessels of the neck. No hemodynamically significant innominate or proximal subclavian artery stenosis. Right carotid system: CCA and ICA patent within the neck without significant stenosis (50% or greater). Minimal atherosclerotic plaque about the carotid. Left carotid system: CCA and ICA patent within the neck without significant stenosis (50% or greater). Minimal atherosclerotic plaque about the carotid bifurcation and within the proximal internal and external carotid arteries. Vertebral arteries: The non dominant right vertebral artery becomes occluded at the V3 level and remains occluded intracranially. Mild nonstenotic calcified plaque at the origin of the right vertebral artery. The dominant left vertebral artery is patent within the neck without stenosis. Skeleton: Cervical levocurvature. Cervical spondylosis, most notably as follows. At C3-C4, a partially calcified central disc protrusion and ossification of the posterior longitudinal ligament contribute to moderate or severe spinal canal stenosis. At C4-C5, posterior disc osteophyte complex and ossification of the posterior longitudinal ligament contribute to severe spinal canal stenosis. At C5-C6, a posterior disc osteophyte complex contributes to moderate or severe spinal canal stenosis. Multilevel bony neural foraminal narrowing. Other neck: No neck mass or cervical lymphadenopathy. Upper chest: No consolidation within the imaged lung apices. Review of the MIP images confirms the above findings CTA HEAD FINDINGS Anterior circulation: The intracranial internal carotid arteries are patent. Atherosclerotic plaque within both vessels. No significant stenosis within the intracranial right ICA. Mild stenosis of the paraclinoid left ICA. Prominent atherosclerotic irregularity of the M1 right MCA. There are sites of severe stenosis within the mid to distal M1 right MCA. Atherosclerotic  irregularity of the M2 and more distal right MCA vessels. Most notably, there is a site of severe stenosis within a proximal right M2 vessel (series 16, image 10). Severe stenosis within the proximal M1 left middle cerebral artery. Atherosclerotic irregularity of the M2 and more distal left middle cerebral arteries. No M2 proximal branch occlusion or high-grade proximal stenosis is identified. The anterior cerebral arteries are patent. Atherosclerotic irregularity of both vessels without high-grade proximal stenosis. No intracranial aneurysm is identified. Posterior circulation: The intracranial right  vertebral artery is occluded. The proximal right PICA is poorly delineated and may also be occluded. The intracranial left vertebral artery is patent without stenosis. 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 The posterior cerebral arteries are patent. Fetal origin right posterior cerebral artery. Atherosclerotic irregularity of both PCAs. Most notably, there are sites of moderate stenosis within the P2 and P3 left PCA. The left posterior communicating artery is diminutive or absent. Venous sinuses: Within the limitations of contrast timing, no convincing thrombus. Anatomic variants: As described. Review of the MIP images confirms the above findings CT Brain Perfusion Findings: CBF (<30%) Volume: 0 mL Perfusion (Tmax>6.0s) volume: 3 mL (anterior left frontal lobe). Mismatch Volume: 3mL Infarction Location:None identified. Critical/salient results called by telephone at the time of interpretation on 10/14/2021 at 8:40 am to provider Dr. Imogene Burn, who verbally acknowledged these results. IMPRESSION: CTA neck: 1. 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 2. The dominant left vertebral artery is patent within the neck without stenosis. 3. The common carotid and internal carotid arteries are patent within the neck without significant  stenosis. Minimal atherosclerotic plaque within the bilateral carotid systems within the neck, as described. 4.  Aortic Atherosclerosis (ICD10-I70.0). CTA head: 1. 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 Additionally, the proximal right PICA is poorly delineated and may also be occluded. 2. Intracranial atherosclerotic disease with multifocal stenoses elsewhere, most notably as follows. 3. 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 4. Sites of moderate stenosis within the P2 and P3 left PCA. 5. Severe stenosis within the mid/distal M1 segment of the right middle cerebral artery. 6. Severe stenosis within a proximal M2 right MCA vessel. 7. Severe stenosis within the proximal M1 segment of the left middle cerebral artery. CT perfusion head: The perfusion software identifies a 3 mL region of critically hypoperfused parenchyma within the anterolateral left frontal lobe (left MCA vascular territory) (utilizing the Tmax>6 seconds threshold). The perfusion software identifies no core infarct. Reported mismatch volume: 3 mL. Electronically Signed: By: Jackey Loge D.O. On: 10/14/2021 08:44   DG Chest Port 1 View  Result Date: 10/13/2021 CLINICAL DATA:  Difficulty swallowing.  Short of breath. EXAM: PORTABLE CHEST 1 VIEW COMPARISON:  None. FINDINGS: Mild cardiac enlargement. Vascularity normal. Lungs are clear without infiltrate or effusion. IMPRESSION: No active disease. Electronically Signed   By: Marlan Palau M.D.   On: 10/13/2021 17:50   ECHOCARDIOGRAM COMPLETE  Result Date: 10/14/2021    ECHOCARDIOGRAM REPORT   Patient Name:   DUILIO HERITAGE [facility] Date of Exam: 10/14/2021 Medical Rec #:  [id number]                Height:       65.0 in Accession #:    [id number]               Weight:       160.0 lb Date of Birth:  1966-07-28                BSA:          1.799 m Patient Age:    55 years                 BP:            155/77 mmHg Patient Gender: M  HR:           72 bpm. Exam Location:  Inpatient Procedure: 2D Echo, Cardiac Doppler, Color Doppler and Intracardiac            Opacification Agent Indications:    Stroke  History:        Patient has no prior history of Echocardiogram examinations.  Sonographer:    Vanetta Shawl Referring Phys: [id number] Delila Pereyra A KYLE IMPRESSIONS  1. Left ventricular ejection fraction, by estimation, is 65 to 70%. The left ventricle has normal function. The left ventricle has no regional wall motion abnormalities. Left ventricular diastolic parameters are indeterminate.  2. Right ventricular systolic function is normal. The right ventricular size is normal.  3. Trivial mitral valve regurgitation.  4. The aortic valve is tricuspid. Aortic valve regurgitation is not visualized.  5. The inferior vena cava is normal in size with greater than 50% respiratory variability, suggesting right atrial pressure of 3 mmHg. FINDINGS  Left Ventricle: Left ventricular ejection fraction, by estimation, is 65 to 70%. The left ventricle has normal function. The left ventricle has no regional wall motion abnormalities. Definity contrast agent was given IV to delineate the left ventricular  endocardial borders. The left ventricular internal cavity size was normal in size. Left ventricular diastolic parameters are indeterminate. Right Ventricle: The right ventricular size is normal. Right vetricular wall thickness was not assessed. Right ventricular systolic function is normal. Left Atrium: Left atrial size was normal in size. Right Atrium: Right atrial size was normal in size. Pericardium: There is no evidence of pericardial effusion. Mitral Valve: There is mild thickening of the mitral valve leaflet(s). Trivial mitral valve regurgitation. Tricuspid Valve: The tricuspid valve is normal in structure. Tricuspid valve regurgitation is trivial. Aortic Valve: The aortic valve is tricuspid. Aortic valve  regurgitation is not visualized. Aortic valve mean gradient measures 5.0 mmHg. Aortic valve peak gradient measures 10.6 mmHg. Pulmonic Valve: The pulmonic valve was normal in structure. Pulmonic valve regurgitation is not visualized. Aorta: The aortic root is normal in size and structure. Venous: The inferior vena cava is normal in size with greater than 50% respiratory variability, suggesting right atrial pressure of 3 mmHg. IAS/Shunts: No atrial level shunt detected by color flow Doppler.   Diastology LV e' medial:    4.03 cm/s LV E/e' medial:  15.4 LV e' lateral:   5.00 cm/s LV E/e' lateral: 12.4  RIGHT VENTRICLE RV Basal diam:  3.70 cm RV S prime:     21.40 cm/s LEFT ATRIUM             Index        RIGHT ATRIUM           Index LA Vol (A2C):   55.8 ml 31.01 ml/m  RA Area:     12.60 cm LA Vol (A4C):   59.7 ml 33.18 ml/m  RA Volume:   23.60 ml  13.12 ml/m LA Biplane Vol: 58.2 ml 32.35 ml/m  AORTIC VALVE                    PULMONIC VALVE AV Vmax:           163.00 cm/s  PV Vmax:       0.96 m/s AV Vmean:          102.000 cm/s PV Peak grad:  3.7 mmHg AV VTI:            0.361 m AV Peak Grad:      10.6  mmHg AV Mean Grad:      5.0 mmHg LVOT Vmax:         95.30 cm/s LVOT Vmean:        57.100 cm/s LVOT VTI:          0.255 m LVOT/AV VTI ratio: 0.71  AORTA Ao Asc diam: 2.90 cm MITRAL VALVE MV Area (PHT): 2.87 cm    SHUNTS MV Decel Time: 264 msec    Systemic VTI: 0.26 m MV E velocity: 62.10 cm/s MV A velocity: 75.80 cm/s MV E/A ratio:  0.82 Dietrich PatesPaula Ross MD Electronically signed by Dietrich PatesPaula Ross MD Signature Date/Time: 10/14/2021/6:25:49 PM    Final    CT ANGIO NECK CODE STROKE  Addendum Date: 10/14/2021   ADDENDUM REPORT: 10/14/2021 10:12 ADDENDUM: Findings omitted from impression: 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 Notably, there is apparent moderate or severe spinal canal stenosis at C3-C4, severe spinal canal stenosis at C4-C5 and apparent moderate or severe spinal canal  stenosis at C5-C6. Multilevel bony neural foraminal narrowing. These results will be called to the ordering clinician or representative by the Radiologist Assistant, and communication documented in the PACS or [employer] Dashboard. Electronically Signed   By: Jackey LogeKyle  Golden D.O.   On: 10/14/2021 10:12   Result Date: 10/14/2021 CLINICAL DATA:  Elevated blood pressure, difficulty swelling, right facial droop. EXAM: CT ANGIOGRAPHY HEAD AND NECK CT PERFUSION BRAIN TECHNIQUE: Multidetector CT imaging of the head and neck was performed using the standard protocol during bolus administration of intravenous contrast. Multiplanar CT image reconstructions and MIPs were obtained to evaluate the vascular anatomy. Carotid stenosis measurements (when applicable) are obtained utilizing NASCET criteria, using the distal internal carotid diameter as the denominator. Multiphase CT imaging of the brain was performed following IV bolus contrast injection. Subsequent parametric perfusion maps were calculated using RAPID software. CONTRAST:  100mL OMNIPAQUE IOHEXOL 350 MG/ML SOLN COMPARISON:  Noncontrast head CT performed earlier today 10/14/2021. Brain MRI performed earlier today 10/14/2021. FINDINGS: CTA NECK FINDINGS Aortic arch: Common origin of the innominate and left common carotid arteries. Minimal atherosclerotic plaque within the visualized aortic arch and proximal major branch vessels of the neck. No hemodynamically significant innominate or proximal subclavian artery stenosis. Right carotid system: CCA and ICA patent within the neck without significant stenosis (50% or greater). Minimal atherosclerotic plaque about the carotid. Left carotid system: CCA and ICA patent within the neck without significant stenosis (50% or greater). Minimal atherosclerotic plaque about the carotid bifurcation and within the proximal internal and external carotid arteries. Vertebral arteries: The non dominant right vertebral artery becomes occluded at  the V3 level and remains occluded intracranially. Mild nonstenotic calcified plaque at the origin of the right vertebral artery. The dominant left vertebral artery is patent within the neck without stenosis. Skeleton: Cervical levocurvature. Cervical spondylosis, most notably as follows. At C3-C4, a partially calcified central disc protrusion and ossification of the posterior longitudinal ligament contribute to moderate or severe spinal canal stenosis. At C4-C5, posterior disc osteophyte complex and ossification of the posterior longitudinal ligament contribute to severe spinal canal stenosis. At C5-C6, a posterior disc osteophyte complex contributes to moderate or severe spinal canal stenosis. Multilevel bony neural foraminal narrowing. Other neck: No neck mass or cervical lymphadenopathy. Upper chest: No consolidation within the imaged lung apices. Review of the MIP images confirms the above findings CTA HEAD FINDINGS Anterior circulation: The intracranial internal carotid arteries are patent. Atherosclerotic plaque within both vessels. No significant stenosis within the intracranial right ICA.  Mild stenosis of the paraclinoid left ICA. Prominent atherosclerotic irregularity of the M1 right MCA. There are sites of severe stenosis within the mid to distal M1 right MCA. Atherosclerotic irregularity of the M2 and more distal right MCA vessels. Most notably, there is a site of severe stenosis within a proximal right M2 vessel (series 16, image 10). Severe stenosis within the proximal M1 left middle cerebral artery. Atherosclerotic irregularity of the M2 and more distal left middle cerebral arteries. No M2 proximal branch occlusion or high-grade proximal stenosis is identified. The anterior cerebral arteries are patent. Atherosclerotic irregularity of both vessels without high-grade proximal stenosis. No intracranial aneurysm is identified. Posterior circulation: The intracranial right vertebral artery is occluded. The  proximal right PICA is poorly delineated and may also be occluded. The intracranial left vertebral artery is patent without stenosis. 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 The posterior cerebral arteries are patent. Fetal origin right posterior cerebral artery. Atherosclerotic irregularity of both PCAs. Most notably, there are sites of moderate stenosis within the P2 and P3 left PCA. The left posterior communicating artery is diminutive or absent. Venous sinuses: Within the limitations of contrast timing, no convincing thrombus. Anatomic variants: As described. Review of the MIP images confirms the above findings CT Brain Perfusion Findings: CBF (<30%) Volume: 0 mL Perfusion (Tmax>6.0s) volume: 3 mL (anterior left frontal lobe). Mismatch Volume: 3mL Infarction Location:None identified. Critical/salient results called by telephone at the time of interpretation on 10/14/2021 at 8:40 am to provider Dr. Imogene Burnhen, who verbally acknowledged these results. IMPRESSION: CTA neck: 1. 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 2. The dominant left vertebral artery is patent within the neck without stenosis. 3. The common carotid and internal carotid arteries are patent within the neck without significant stenosis. Minimal atherosclerotic plaque within the bilateral carotid systems within the neck, as described. 4.  Aortic Atherosclerosis (ICD10-I70.0). CTA head: 1. 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 Additionally, the proximal right PICA is poorly delineated and may also be occluded. 2. Intracranial atherosclerotic disease with multifocal stenoses elsewhere, most notably as follows. 3. 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 4. Sites of moderate stenosis within the P2 and P3 left PCA. 5. Severe stenosis within the mid/distal M1 segment  of the right middle cerebral artery. 6. Severe stenosis within a proximal M2 right MCA vessel. 7. Severe stenosis within the proximal M1 segment of the left middle cerebral artery. CT perfusion head: The perfusion software identifies a 3 mL region of critically hypoperfused parenchyma within the anterolateral left frontal lobe (left MCA vascular territory) (utilizing the Tmax>6 seconds threshold). The perfusion software identifies no core infarct. Reported mismatch volume: 3 mL. Electronically Signed: By: Jackey LogeKyle  Golden D.O. On: 10/14/2021 08:44    Microbiology: Results for orders placed or performed during the hospital encounter of 10/13/21  Resp Panel by RT-PCR (Flu A&B, Covid) Nasopharyngeal Swab     Status: None   Collection Time: 10/13/21 12:29 PM   Specimen: Nasopharyngeal Swab; Nasopharyngeal(NP) swabs in vial transport medium  Result Value Ref Range Status   SARS Coronavirus 2 by RT PCR NEGATIVE NEGATIVE Final    Comment: (NOTE) SARS-CoV-2 target nucleic acids are NOT DETECTED.  The SARS-CoV-2 RNA is generally detectable in upper respiratory specimens during the acute phase of infection. The lowest concentration of SARS-CoV-2 viral copies this assay can detect is 138 copies/mL. A negative result  does not preclude SARS-Cov-2 infection and should not be used as the sole basis for treatment or other patient management decisions. A negative result may occur with  improper specimen collection/handling, submission of specimen other than nasopharyngeal swab, presence of viral mutation(s) within the areas targeted by this assay, and inadequate number of viral copies(<138 copies/mL). A negative result must be combined with clinical observations, patient history, and epidemiological information. The expected result is Negative.  Fact Sheet for Patients:  BloggerCourse.com  Fact Sheet for Healthcare Providers:  SeriousBroker.it  This test is  no t yet approved or cleared by the [place] FDA and  has been authorized for detection and/or diagnosis of SARS-CoV-2 by FDA under an Emergency Use Authorization (EUA). This EUA will remain  in effect (meaning this test can be used) for the duration of the COVID-19 declaration under Section 564(b)(1) of the Act, 21 U.S.C.section 360bbb-3(b)(1), unless the authorization is terminated  or revoked sooner.       Influenza A by PCR NEGATIVE NEGATIVE Final   Influenza B by PCR NEGATIVE NEGATIVE Final    Comment: (NOTE) The Xpert Xpress SARS-CoV-2/FLU/RSV plus assay is intended as an aid in the diagnosis of influenza from Nasopharyngeal swab specimens and should not be used as a sole basis for treatment. Nasal washings and aspirates are unacceptable for Xpert Xpress SARS-CoV-2/FLU/RSV testing.  Fact Sheet for Patients: BloggerCourse.com  Fact Sheet for Healthcare Providers: SeriousBroker.it  This test is not yet approved or cleared by the [place] FDA and has been authorized for detection and/or diagnosis of SARS-CoV-2 by FDA under an Emergency Use Authorization (EUA). This EUA will remain in effect (meaning this test can be used) for the duration of the COVID-19 declaration under Section 564(b)(1) of the Act, 21 U.S.C. section 360bbb-3(b)(1), unless the authorization is terminated or revoked.  Performed at [facility], [address] [place], [place] [address]   Group A Strep by PCR     Status: None   Collection Time: 10/13/21 12:29 PM   Specimen: Nasopharyngeal Swab; Sterile Swab  Result Value Ref Range Status   Group A Strep by PCR NOT DETECTED NOT DETECTED Final    Comment: Performed at [facility], [address] [place], [place] [address]    Labs: CBC: Recent Labs  Lab 10/13/21 1228  WBC 7.3  NEUTROABS 5.5  HGB 15.7  HCT 45.2  MCV 90.0  PLT 274   Basic Metabolic Panel: Recent  Labs  Lab 10/13/21 1334 10/14/21 1446  NA 136 135  K 3.3* 4.0  CL 96* 103  CO2 28 26  GLUCOSE 121* 140*  BUN 10 18  CREATININE 0.81 0.96  CALCIUM 9.2 8.7*  MG  --  2.3   Liver Function Tests: Recent Labs  Lab 10/13/21 1334 10/14/21 1446  AST 26 24  ALT 21 17  ALKPHOS 80 66  BILITOT 1.2 1.9*  PROT 8.8* 8.0  ALBUMIN 4.8 4.3   CBG: No results for input(s): GLUCAP in the last 168 hours.  Discharge time spent: greater than 30 minutes.  Signed: Brendia Sacks, MD [facility] 10/16/2021
